# Patient Record
Sex: Male | Born: 1990 | Race: White | Hispanic: No | Marital: Married | Smoking: Never smoker
Health system: Southern US, Community
[De-identification: ages and names within clinical notes are randomized; demographics above are authoritative.]

## PROBLEM LIST (undated history)

## (undated) DIAGNOSIS — Z87442 Personal history of urinary calculi: Secondary | ICD-10-CM

## (undated) DIAGNOSIS — F319 Bipolar disorder, unspecified: Secondary | ICD-10-CM

## (undated) DIAGNOSIS — B179 Acute viral hepatitis, unspecified: Secondary | ICD-10-CM

## (undated) DIAGNOSIS — N200 Calculus of kidney: Secondary | ICD-10-CM

## (undated) DIAGNOSIS — F909 Attention-deficit hyperactivity disorder, unspecified type: Secondary | ICD-10-CM

## (undated) DIAGNOSIS — R51 Headache: Secondary | ICD-10-CM

## (undated) DIAGNOSIS — J189 Pneumonia, unspecified organism: Secondary | ICD-10-CM

## (undated) HISTORY — PX: BRAIN SURGERY: SHX531

## (undated) HISTORY — PX: KNEE SURGERY: SHX244

---
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
## 2013-04-25 ENCOUNTER — Emergency Department (HOSPITAL_COMMUNITY)
Admission: EM | Admit: 2013-04-25 | Discharge: 2013-04-25 | Disposition: A | Discharge status: Home / Self Care | Payer: Managed Care, Other (non HMO) | Source: Home / Self Care | Attending: Emergency Medicine | Admitting: Emergency Medicine

## 2013-04-25 ENCOUNTER — Encounter (HOSPITAL_COMMUNITY): Payer: Self-pay | Admitting: Emergency Medicine

## 2013-04-25 DIAGNOSIS — S83006A Unspecified dislocation of unspecified patella, initial encounter: Secondary | ICD-10-CM

## 2013-04-25 MED ORDER — HYDROCODONE-ACETAMINOPHEN 5-325 MG PO TABS: ORAL_TABLET | ORAL | Status: DC

## 2013-04-25 MED ORDER — DICLOFENAC SODIUM 75 MG PO TBEC: 75.0000 mg | DELAYED_RELEASE_TABLET | Freq: Two times a day (BID) | ORAL | Status: DC

## 2013-04-25 NOTE — ED Notes (Signed)
States he had a skateboard accident ~ 7 months ago, AND SINCE THEN, HAS HAD A RECURRENT episodes of knee coming out of place, came out twice today

## 2013-04-25 NOTE — ED Provider Notes (Signed)
  Chief Complaint   Chief Complaint  Patient presents with  . Knee Problem    History of Present Illness   Gregory [name] is a 23 year old male who injured his right knee in a skateboarding accident 7 months ago. He dislocated the patella laterally. [facility] made which were negative. Ever since then he's had recurring episodes of patellar dislocation, pain, swelling, and popping of the knee. It sometimes gives way. He localizes the pain to around the patella and sometimes in the popliteal fossa.  Review of Systems   Other than as noted above, the patient denies any of the following symptoms: Systemic:  No fevers or chills. Musculoskeletal:  No arthritis, swelling, back pain, or neck pain.  Neurological:  No muscular weakness or paresthesias.  PMFSH   Past medical history, family history, social history, meds, and allergies were reviewed.     Physical Examination   Vital signs:  BP 129/82  Pulse 75  Temp(Src) 98.6 F (37 C) (Oral)  Resp 20  SpO2 99% Gen:  Alert and oriented times 3.  In no distress. Musculoskeletal: He has pain to palpation over the patella, around the patella, and over the medial and lateral joint lines and in the popliteal fossa. The knee has a full range of motion with pain on full flexion. There is crepitus with flexion.   McMurray's test was negative.  Lachman's test was negative.  Anterior drawer test was negative.   Varus and valgus stress produces pain in both medially and laterally, but no opening up of the joint.  Otherwise, all joints had a full a ROM with no swelling, bruising or deformity.  No edema, pulses full. Extremities were warm and pink.  Capillary refill was brisk.  Skin:  Clear, warm and dry.  No rash. Neuro:  Alert and oriented times 3.  Muscle strength was normal.  Sensation was intact to light touch.   Assessment   The encounter diagnosis was Dislocated patella.  He has recurring dislocations of the  patella. This will probably require surgery. He'll need followup with orthopedics. In the meantime he was begun on quadriceps strengthening exercises, should wear a knee immobilizer when he can otherwise wear a knee sleeve.  Plan    1.  Meds:  The following meds were prescribed:   New Prescriptions   DICLOFENAC (VOLTAREN) 75 MG EC TABLET    Take 1 tablet (75 mg total) by mouth 2 (two) times daily.   HYDROCODONE-ACETAMINOPHEN (NORCO/VICODIN) 5-325 MG PER TABLET    1 to 2 tabs every 4 to 6 hours as needed for pain.    2.  Patient Education/Counseling:  The patient was given appropriate handouts, self care instructions, and instructed in symptomatic relief, including rest and activity, elevation, application of ice and compression.    3.  Follow up:  The patient was told to follow up here if no better in 3 to 4 days, or sooner if becoming worse in any way, and given some red flag symptoms such as worsening pain or neurological symptoms which would prompt immediate return.       Reuben Likesavid C Brandilyn Nanninga, MD 04/25/13 [phone]

## 2013-04-25 NOTE — Discharge Instructions (Signed)
Knee Dislocation Knee dislocation occurs when two or more linked knee bones shift out of alignment, and no longer touch. For this to happen, an injury must also occur to more than one of the four major ligaments of the knee. A ligament is soft tissue that holds bones together, across a joint. In the knee, ligaments hold the thigh bone (femur) and the shinbone (tibia) in place. When a ligament is stretched farther than it is designed to stretch, the ligament is at risk for sprain. Knee ligament sprains can be a disabling injury. SYMPTOMS   One or more "pops" heard or felt, at the time of injury.  Inability to continue activity after injury.  Knee swelling, noticed within 6 hours after injury. Possibly, deformity of the knee.  Loss of knee motion.  Knee giving way or buckling. Often, swelling with repeated giving way.  Sometimes locking, when there is also injury to the meniscus cartilage in the knee.  Rarely, injury to nerves (numbness, weakness, paralysis), discoloration, or coldness (due to artery injury) of the foot and ankle. CAUSES  Knee dislocation is the result of a force on the joint that is greater than the ligaments can withstand. This is often caused by a direct hit (trauma). It may also result from a non-contact injury (stepping in a hole in the ground, over extending the knee, twisting).  RISK INCREASES WITH:  Sports that involve pivoting, jumping, cutting, or changing direction (basketball, gymnastics, soccer, volleyball). Contact sports (football, rugby, lacrosse). Sports on uneven ground (cross country running, soccer).  Poor leg strength and flexibility.  Improper equipment. PREVENTION   Warm up and stretch properly before activity.  Maintain physical fitness:  Thigh, leg, and knee flexibility.  Muscle strength and endurance.  Learn and use proper exercise technique.  Wear proper equipment (correct length of cleats for the surface). PROGNOSIS  If treated  properly, which may require surgery, knee dislocations can be healed. Returning to sports is possible, but not guaranteed. Untreated knee dislocations often result in recurring injury or instability of the knee joint. RELATED COMPLICATIONS   Frequently recurring symptoms, such as knee giving way, instability, and swelling.  Injury to meniscal cartilage, resulting in locking and swelling of the knee.  Injury to other structures of the knee (bone, articular cartilage), resulting in knee arthritis.  Injury to other ligaments of the knee.  Knee stiffness (loss of knee motion).  Permanent injury to nerves (numbness, weakness, paralysis) or arteries.  Removal (amputation) of the leg, due to nerve or artery injury. TREATMENT Knee dislocations require immediate realigning of the bones (reduction), if they are displaced. This should be followed up with artery and nerve function tests. The knee should then be treated with ice and medicine, to reduce pain and inflammation. Walking with crutches is advised, to keep weight off the injury. Your caregiver may advise that you wear a brace, at first. Rehabilitation will be needed, to regain strength and control of knee function. A physical therapist may teach you techniques to avoid further knee injury. Surgery that is properly timed after the injury is often the best way to achieve a full recovery. Surgery involves replacement (reconstruction) of one or more of the injured ligaments. If it has been determined that you have an injury to an artery, this will also require surgery. MEDICATION   If pain medicine is needed, nonsteroidal anti-inflammatory medicines (aspirin and ibuprofen), or other minor pain relievers (acetaminophen), are often advised.  Do not take pain medicine for 7 days before  surgery.  Prescription pain relievers are usually prescribed only after surgery. Use only as directed and only as much as you need. HEAT AND COLD  Cold treatment  (icing) should be applied for 10 to 15 minutes every 2 to 3 hours for inflammation and pain, and immediately after activity that aggravates your symptoms. Use ice packs or an ice massage.  Heat treatment may be used before performing stretching and strengthening activities prescribed by your caregiver, physical therapist, or athletic trainer. Use a heat pack or a warm water soak. SEEK MEDICAL CARE IF:  Any of the following occur after injury or surgery:  You experience pain, numbness, coldness or blue, gray, or dark discoloration in the foot or toenails.  You develop signs of infection: fever, increased pain, swelling, redness, drainage of fluids, or bleeding in the affected area.  New, unexplained symptoms develop. (Drugs used in treatment may produce side effects.) Document Released: 01/25/2005 Document Revised: 04/19/2011 Document Reviewed: 05/09/2008 [facility] Patient Information 2014 [place], [place].  Knee pain can be caused by many conditions:  Osteoarthritis, gout, bursitis, tendonitis, cartiledge damage, condromalacia patella, patellofemoral syndrome, and ligament sprain to name just a few.  Often some simple conservative measures can help alleviate the pain.  Do not do the following:  Avoid squatting and doing deep knee bends.  This puts too much of load on your cartiledges and tendons.  If you do a knee bend, go only half way down, flexing your knee no more than 90 degrees.  Do the following:  If you are overweight or obese, lose weight.  This makes for a lot less load on your knee joints.  If you use tobacco, quit.  Nicotine causes spasm of the small arteries, decreases blood flow, and impairs your body's normal ability to repair damage.  If your knee is acutely inflamed, use the principles of RICE (rest, ice, compression, and elevation).  Wearing a knee brace can help.  These are usually made of neoprene and can be purchased over the counter at the drug store.  Use of over  the counter pain meds can be of help.  Tylenol (or acetaminophen) is the safest to use.  It often helps to take this regularly.  You can take up to 2 325 mg tablets 5 times daily, but it best to start out much lower that that, perhaps 2 325 mg tablets twice daily, then increase from there. People who are on the blood thinner warfarin have to be careful about taking high doses of Tylenol.  For people who are able to tolerate them, ibuprofen and naproxyn can also help with the pain.  You should discuss these agents with your physician before taking them.  People with chronic kidney disease, hypertension, peptic ulcer disease, and reflux can suffer adverse side effects. They should not be taken with warfarin. The maximum dosage of ibuprofen is 800 mg 3 times daily with meals.  The maximum dosage of naprosyn is 2 and 1/2 tablets twice daily with food, but again, start out low and gradually increase the dose until adequate pain relief is achieved. Ibuprofen and naprosyn should always be taken with food.  People with cartiledge injury or osteoarthritis may find glucosamine to be helpful.  This is an over-the-counter supplement that helps nourish and repair cartiledge.  The dose is 500 mg 3 times daily or 1500 mg taken in a single dose. This can take several months to work and it doesn't always work.    For people with knee pain  on just one side, use of a cane held in the hand on the same side as the knee pain takes some of the stress off the knee joint and can make a big difference in knee pain.  Wearing good shoes with adequate arch support is essential.  Regular exercise is of utmost importance.  Swimming, water aerobics, or use of an elliptical exerciser put the least stress on the knees of any exercise.  Finally doing the exercises below can be very helpful.  They tend to strengthen the muscles around the knee and provide extra support and stability.  Try to do them twice a day followed by ice for 10  minutes.

## 2013-06-12 ENCOUNTER — Other Ambulatory Visit (HOSPITAL_COMMUNITY): Payer: Self-pay | Admitting: Orthopedic Surgery

## 2013-06-25 ENCOUNTER — Encounter (HOSPITAL_COMMUNITY): Payer: Self-pay

## 2013-06-25 ENCOUNTER — Encounter (HOSPITAL_COMMUNITY)
Admission: RE | Admit: 2013-06-25 | Discharge: 2013-06-25 | Disposition: A | Discharge status: Home / Self Care | Payer: Managed Care, Other (non HMO) | Source: Ambulatory Visit | Attending: Orthopedic Surgery | Admitting: Orthopedic Surgery

## 2013-06-25 DIAGNOSIS — Z01812 Encounter for preprocedural laboratory examination: Secondary | ICD-10-CM | POA: Insufficient documentation

## 2013-06-25 HISTORY — DX: Bipolar disorder, unspecified: F31.9

## 2013-06-25 HISTORY — DX: Pneumonia, unspecified organism: J18.9

## 2013-06-25 HISTORY — DX: Headache: R51

## 2013-06-25 HISTORY — DX: Attention-deficit hyperactivity disorder, unspecified type: F90.9

## 2013-06-25 LAB — CBC
HEMATOCRIT: 45.2 % (ref 39.0–52.0)
HEMOGLOBIN: 15.5 g/dL (ref 13.0–17.0)
MCH: 28.3 pg (ref 26.0–34.0)
MCHC: 34.3 g/dL (ref 30.0–36.0)
MCV: 82.6 fL (ref 78.0–100.0)
PLATELETS: 265 10*3/uL (ref 150–400)
RBC: 5.47 MIL/uL (ref 4.22–5.81)
RDW: 12.2 % (ref 11.5–15.5)
WBC: 6.4 10*3/uL (ref 4.0–10.5)

## 2013-06-25 NOTE — Pre-Procedure Instructions (Signed)
Gregory DockerColton T Bolds  06/25/2013   Your procedure is scheduled on:  Tuesday, Jul 03, 2013  [facility] [address] at [employer]10:45 AM.  Call this number if you have problems the morning of surgery: [phone]   Remember:   Do not eat food or drink liquids after midnight.   Take these medicines the morning of surgery with A SIP OF WATER: Hydrocodone-acetaminophen (Norco/Vicodin) if needed  STOP taking diclofenac (Voltaren) as directed by physician. Stop taking Aspirin, Goody's, BC's, Aleve (Naproxen), Ibuprofen (Advil or Motrin), Fish Oil, Vitamins, Herbal Supplements or any substance that could thin your blood starting today, Jun 25, 2013.   Do not wear jewelry.  Do not wear lotions, powders, or colognes. You may wear deodorant.  Do not shave 48 hours prior to surgery. Men may shave face and neck.  Do not bring valuables to the hospital.  [facility] is not responsible                  for any belongings or valuables.               Contacts, dentures or bridgework may not be worn into surgery.  Leave suitcase in the car. After surgery it may be brought to your room.  For patients admitted to the hospital, discharge time is determined by your                treatment team.               Patients discharged the day of surgery will not be allowed to drive  home.    Special Instructions: Please use CHG soap the night before surgery and the day of surgery. CHG soap should be used atleast twice.   Please read over the following fact sheets that you were given: Pain Booklet, Coughing and Deep Breathing and Surgical Site Infection Prevention.

## 2013-07-02 MED ORDER — CHLORHEXIDINE GLUCONATE 4 % EX LIQD
60.0000 mL | Freq: Once | CUTANEOUS | Status: DC
Start: 2013-07-02 — End: 2013-07-03
  Filled 2013-07-02: qty 60

## 2013-07-02 MED ORDER — CEFAZOLIN SODIUM-DEXTROSE 2-3 GM-% IV SOLR
2.0000 g | INTRAVENOUS | Status: AC
  Administered 2013-07-03 (×2): 2 g via INTRAVENOUS
  Filled 2013-07-02: qty 50

## 2013-07-02 MED ORDER — CHLORHEXIDINE GLUCONATE 4 % EX LIQD
60.0000 mL | Freq: Once | CUTANEOUS | Status: DC
  Filled 2013-07-02: qty 60

## 2013-07-03 ENCOUNTER — Ambulatory Visit (HOSPITAL_COMMUNITY): Payer: Managed Care, Other (non HMO) | Admitting: Anesthesiology

## 2013-07-03 ENCOUNTER — Encounter (HOSPITAL_COMMUNITY): Payer: Self-pay | Admitting: Anesthesiology

## 2013-07-03 ENCOUNTER — Encounter (HOSPITAL_COMMUNITY)
Admission: RE | Disposition: A | Discharge status: Home / Self Care | Payer: Self-pay | Source: Ambulatory Visit | Attending: Orthopedic Surgery

## 2013-07-03 ENCOUNTER — Encounter (HOSPITAL_COMMUNITY): Payer: Managed Care, Other (non HMO) | Admitting: Anesthesiology

## 2013-07-03 ENCOUNTER — Observation Stay (HOSPITAL_COMMUNITY)
Admission: RE | Admit: 2013-07-03 | Discharge: 2013-07-04 | Disposition: A | Discharge status: Home / Self Care | Payer: Managed Care, Other (non HMO) | Source: Ambulatory Visit | Attending: Orthopedic Surgery | Admitting: Orthopedic Surgery

## 2013-07-03 DIAGNOSIS — F909 Attention-deficit hyperactivity disorder, unspecified type: Secondary | ICD-10-CM | POA: Insufficient documentation

## 2013-07-03 DIAGNOSIS — M238X9 Other internal derangements of unspecified knee: Secondary | ICD-10-CM | POA: Insufficient documentation

## 2013-07-03 DIAGNOSIS — S83509A Sprain of unspecified cruciate ligament of unspecified knee, initial encounter: Principal | ICD-10-CM | POA: Insufficient documentation

## 2013-07-03 DIAGNOSIS — S83519A Sprain of anterior cruciate ligament of unspecified knee, initial encounter: Secondary | ICD-10-CM

## 2013-07-03 DIAGNOSIS — F319 Bipolar disorder, unspecified: Secondary | ICD-10-CM | POA: Insufficient documentation

## 2013-07-03 DIAGNOSIS — IMO0002 Reserved for concepts with insufficient information to code with codable children: Secondary | ICD-10-CM | POA: Insufficient documentation

## 2013-07-03 DIAGNOSIS — X58XXXA Exposure to other specified factors, initial encounter: Secondary | ICD-10-CM | POA: Insufficient documentation

## 2013-07-03 HISTORY — PX: POSTERIOR CRUCIATE LIGAMENT RECONSTRUCTION: SHX749

## 2013-07-03 HISTORY — PX: ANTERIOR CRUCIATE LIGAMENT REPAIR: SHX115

## 2013-07-03 LAB — PROTIME-INR
INR: 1.17 (ref 0.00–1.49)
Prothrombin Time: 14.7 seconds (ref 11.6–15.2)

## 2013-07-03 SURGERY — RECONSTRUCTION, KNEE, ACL, USING HAMSTRING GRAFT
Anesthesia: General | Site: Knee | Laterality: Right

## 2013-07-03 MED ORDER — CLONIDINE HCL (ANALGESIA) 100 MCG/ML EP SOLN
EPIDURAL | Status: DC | PRN
  Administered 2013-07-03: 1 mL via INTRA_ARTICULAR

## 2013-07-03 MED ORDER — OXYCODONE HCL 5 MG PO TABS
5.0000 mg | ORAL_TABLET | ORAL | Status: DC | PRN
  Administered 2013-07-03 – 2013-07-04 (×2): 10 mg via ORAL
  Filled 2013-07-03 (×2): qty 2

## 2013-07-03 MED ORDER — BUPIVACAINE HCL (PF) 0.25 % IJ SOLN
INTRAMUSCULAR | Status: DC | PRN
  Administered 2013-07-03: 10 mL

## 2013-07-03 MED ORDER — ONDANSETRON HCL 4 MG/2ML IJ SOLN
INTRAMUSCULAR | Status: DC | PRN
  Administered 2013-07-03: 4 mg via INTRAVENOUS

## 2013-07-03 MED ORDER — METOCLOPRAMIDE HCL 5 MG/ML IJ SOLN: 5.0000 mg | Freq: Three times a day (TID) | INTRAMUSCULAR | Status: DC | PRN

## 2013-07-03 MED ORDER — KETOROLAC TROMETHAMINE 30 MG/ML IJ SOLN: 15.0000 mg | Freq: Once | INTRAMUSCULAR | Status: DC | PRN

## 2013-07-03 MED ORDER — GLYCOPYRROLATE 0.2 MG/ML IJ SOLN
INTRAMUSCULAR | Status: AC
  Filled 2013-07-03: qty 2

## 2013-07-03 MED ORDER — OXYCODONE HCL 5 MG PO TABS: 5.0000 mg | ORAL_TABLET | Freq: Once | ORAL | Status: DC | PRN

## 2013-07-03 MED ORDER — MORPHINE SULFATE 4 MG/ML IJ SOLN
INTRAMUSCULAR | Status: AC
  Filled 2013-07-03: qty 2

## 2013-07-03 MED ORDER — METHOCARBAMOL 1000 MG/10ML IJ SOLN
500.0000 mg | Freq: Four times a day (QID) | INTRAVENOUS | Status: DC | PRN
  Filled 2013-07-03: qty 5

## 2013-07-03 MED ORDER — ACETAMINOPHEN 160 MG/5ML PO SOLN
325.0000 mg | ORAL | Status: DC | PRN
  Filled 2013-07-03: qty 20.3

## 2013-07-03 MED ORDER — MIDAZOLAM HCL 2 MG/2ML IJ SOLN: 1.0000 mg | INTRAMUSCULAR | Status: DC | PRN

## 2013-07-03 MED ORDER — MORPHINE SULFATE 4 MG/ML IJ SOLN
INTRAMUSCULAR | Status: DC | PRN
  Administered 2013-07-03: 8 mg via INTRAVENOUS

## 2013-07-03 MED ORDER — ONDANSETRON HCL 4 MG/2ML IJ SOLN
INTRAMUSCULAR | Status: AC
  Filled 2013-07-03: qty 2

## 2013-07-03 MED ORDER — OXYCODONE HCL 5 MG/5ML PO SOLN: 5.0000 mg | Freq: Once | ORAL | Status: DC | PRN

## 2013-07-03 MED ORDER — POTASSIUM CHLORIDE IN NACL 20-0.9 MEQ/L-% IV SOLN
INTRAVENOUS | Status: DC
  Administered 2013-07-03: 19:00:00 via INTRAVENOUS
  Filled 2013-07-03 (×3): qty 1000

## 2013-07-03 MED ORDER — WARFARIN - PHARMACIST DOSING INPATIENT: Freq: Every day | Status: DC

## 2013-07-03 MED ORDER — DOCUSATE SODIUM 100 MG PO CAPS
100.0000 mg | ORAL_CAPSULE | Freq: Two times a day (BID) | ORAL | Status: DC
  Administered 2013-07-03 – 2013-07-04 (×2): 100 mg via ORAL
  Filled 2013-07-03 (×3): qty 1

## 2013-07-03 MED ORDER — 0.9 % SODIUM CHLORIDE (POUR BTL) OPTIME
TOPICAL | Status: DC | PRN
  Administered 2013-07-03: 1000 mL

## 2013-07-03 MED ORDER — FENTANYL CITRATE 0.05 MG/ML IJ SOLN
INTRAMUSCULAR | Status: AC
  Filled 2013-07-03: qty 5

## 2013-07-03 MED ORDER — SODIUM CHLORIDE 0.9 % IR SOLN
Status: DC | PRN
  Administered 2013-07-03: 18000 mL
  Administered 2013-07-03: 3000 mL

## 2013-07-03 MED ORDER — ONDANSETRON HCL 4 MG PO TABS: 4.0000 mg | ORAL_TABLET | Freq: Four times a day (QID) | ORAL | Status: DC | PRN

## 2013-07-03 MED ORDER — FENTANYL CITRATE 0.05 MG/ML IJ SOLN: 50.0000 ug | INTRAMUSCULAR | Status: DC | PRN

## 2013-07-03 MED ORDER — ROCURONIUM BROMIDE 100 MG/10ML IV SOLN
INTRAVENOUS | Status: DC | PRN
  Administered 2013-07-03: 50 mg via INTRAVENOUS

## 2013-07-03 MED ORDER — WARFARIN SODIUM 7.5 MG PO TABS
7.5000 mg | ORAL_TABLET | Freq: Once | ORAL | Status: AC
  Administered 2013-07-03: 7.5 mg via ORAL
  Filled 2013-07-03: qty 1

## 2013-07-03 MED ORDER — LACTATED RINGERS IV SOLN
INTRAVENOUS | Status: DC | PRN
  Administered 2013-07-03 (×2): via INTRAVENOUS

## 2013-07-03 MED ORDER — PROMETHAZINE HCL 25 MG/ML IJ SOLN
6.2500 mg | INTRAMUSCULAR | Status: DC | PRN
  Administered 2013-07-03: 6.25 mg via INTRAVENOUS

## 2013-07-03 MED ORDER — METHOCARBAMOL 500 MG PO TABS
500.0000 mg | ORAL_TABLET | Freq: Four times a day (QID) | ORAL | Status: DC | PRN
  Administered 2013-07-03 – 2013-07-04 (×2): 500 mg via ORAL
  Filled 2013-07-03 (×2): qty 1

## 2013-07-03 MED ORDER — ROPIVACAINE HCL 5 MG/ML IJ SOLN
INTRAMUSCULAR | Status: DC | PRN
  Administered 2013-07-03: 16 mL via PERINEURAL

## 2013-07-03 MED ORDER — LACTATED RINGERS IV SOLN
INTRAVENOUS | Status: DC
  Administered 2013-07-03: 50 mL/h via INTRAVENOUS

## 2013-07-03 MED ORDER — ACETAMINOPHEN 325 MG PO TABS: 325.0000 mg | ORAL_TABLET | ORAL | Status: DC | PRN

## 2013-07-03 MED ORDER — PROMETHAZINE HCL 25 MG/ML IJ SOLN
INTRAMUSCULAR | Status: AC
Start: 2013-07-03 — End: 2013-07-04
  Filled 2013-07-03: qty 1

## 2013-07-03 MED ORDER — LIDOCAINE HCL (CARDIAC) 20 MG/ML IV SOLN
INTRAVENOUS | Status: AC
  Filled 2013-07-03: qty 5

## 2013-07-03 MED ORDER — BUPIVACAINE HCL (PF) 0.25 % IJ SOLN
INTRAMUSCULAR | Status: AC
  Filled 2013-07-03: qty 30

## 2013-07-03 MED ORDER — PROPOFOL 10 MG/ML IV BOLUS
INTRAVENOUS | Status: AC
  Filled 2013-07-03: qty 20

## 2013-07-03 MED ORDER — ROCURONIUM BROMIDE 50 MG/5ML IV SOLN
INTRAVENOUS | Status: AC
  Filled 2013-07-03: qty 1

## 2013-07-03 MED ORDER — MORPHINE SULFATE 2 MG/ML IJ SOLN
1.0000 mg | INTRAMUSCULAR | Status: DC | PRN
  Administered 2013-07-03 – 2013-07-04 (×2): 1 mg via INTRAVENOUS
  Filled 2013-07-03 (×2): qty 1

## 2013-07-03 MED ORDER — MIDAZOLAM HCL 5 MG/5ML IJ SOLN
INTRAMUSCULAR | Status: DC | PRN
  Administered 2013-07-03 (×2): 1 mg via INTRAVENOUS

## 2013-07-03 MED ORDER — MIDAZOLAM HCL 2 MG/2ML IJ SOLN
INTRAMUSCULAR | Status: AC
  Filled 2013-07-03: qty 2

## 2013-07-03 MED ORDER — ONDANSETRON HCL 4 MG/2ML IJ SOLN: 4.0000 mg | Freq: Four times a day (QID) | INTRAMUSCULAR | Status: DC | PRN

## 2013-07-03 MED ORDER — CEFAZOLIN SODIUM-DEXTROSE 2-3 GM-% IV SOLR
INTRAVENOUS | Status: AC
  Filled 2013-07-03: qty 50

## 2013-07-03 MED ORDER — HYDROMORPHONE HCL PF 1 MG/ML IJ SOLN: 0.2500 mg | INTRAMUSCULAR | Status: DC | PRN

## 2013-07-03 MED ORDER — FENTANYL CITRATE 0.05 MG/ML IJ SOLN
INTRAMUSCULAR | Status: DC | PRN
  Administered 2013-07-03: 100 ug via INTRAVENOUS
  Administered 2013-07-03 (×3): 50 ug via INTRAVENOUS

## 2013-07-03 MED ORDER — METOCLOPRAMIDE HCL 10 MG PO TABS: 5.0000 mg | ORAL_TABLET | Freq: Three times a day (TID) | ORAL | Status: DC | PRN

## 2013-07-03 MED ORDER — WARFARIN VIDEO: Freq: Once | Status: DC

## 2013-07-03 MED ORDER — NEOSTIGMINE METHYLSULFATE 10 MG/10ML IV SOLN
INTRAVENOUS | Status: AC
  Filled 2013-07-03: qty 1

## 2013-07-03 MED ORDER — COUMADIN BOOK
Freq: Once | Status: AC
  Administered 2013-07-03: 22:00:00
  Filled 2013-07-03: qty 1

## 2013-07-03 MED ORDER — CEFAZOLIN SODIUM 1-5 GM-% IV SOLN
1.0000 g | Freq: Three times a day (TID) | INTRAVENOUS | Status: AC
  Administered 2013-07-03 – 2013-07-04 (×2): 1 g via INTRAVENOUS

## 2013-07-03 MED ORDER — PROPOFOL 10 MG/ML IV BOLUS
INTRAVENOUS | Status: DC | PRN
  Administered 2013-07-03: 200 mg via INTRAVENOUS

## 2013-07-03 MED ORDER — LIDOCAINE HCL (CARDIAC) 20 MG/ML IV SOLN
INTRAVENOUS | Status: DC | PRN
  Administered 2013-07-03: 100 mg via INTRAVENOUS

## 2013-07-03 SURGICAL SUPPLY — 113 items
ANCHOR BUTTON TIGHTROPE ACL RT (Orthopedic Implant) ×6 IMPLANT
ANCHOR SUT SWIVELLOK BIO (Anchor) ×6 IMPLANT
BANDAGE ELASTIC 6 VELCRO ST LF (GAUZE/BANDAGES/DRESSINGS) ×6 IMPLANT
BANDAGE ESMARK 6X9 LF (GAUZE/BANDAGES/DRESSINGS) ×1 IMPLANT
BLADE CUDA 5.5 (BLADE) IMPLANT
BLADE CUTTER GATOR 3.5 (BLADE) ×3 IMPLANT
BLADE GREAT WHITE 4.2 (BLADE) ×2 IMPLANT
BLADE GREAT WHITE 4.2MM (BLADE) ×1
BLADE MINI RND TIP GREEN BEAV (BLADE) IMPLANT
BLADE SURG 10 STRL SS (BLADE) ×3 IMPLANT
BLADE SURG 15 STRL LF DISP TIS (BLADE) ×3 IMPLANT
BLADE SURG 15 STRL SS (BLADE) ×6
BNDG ELASTIC 6X10 VLCR STRL LF (GAUZE/BANDAGES/DRESSINGS) ×3 IMPLANT
BNDG ELASTIC 6X15 VLCR STRL LF (GAUZE/BANDAGES/DRESSINGS) ×3 IMPLANT
BNDG ESMARK 6X9 LF (GAUZE/BANDAGES/DRESSINGS) ×3
BONE MATRIX DEMINERALIZED 1CC (Bone Implant) ×6 IMPLANT
BUR OVAL 6.0 (BURR) ×3 IMPLANT
BUTTON EXT TIGHTROPE 5X20 (Orthopedic Implant) ×2 IMPLANT
BUTTON EXT TIGHTROPE 5X20MM (Orthopedic Implant) ×1 IMPLANT
CANNULA 5.75X71 LONG (CANNULA) IMPLANT
CANNULA TWIST IN 8.25X7CM (CANNULA) IMPLANT
CLOSURE STERI-STRIP 1/2X4 (GAUZE/BANDAGES/DRESSINGS) ×1
CLOSURE WOUND 1/2 X4 (GAUZE/BANDAGES/DRESSINGS) ×2
CLSR STERI-STRIP ANTIMIC 1/2X4 (GAUZE/BANDAGES/DRESSINGS) ×2 IMPLANT
COVER SURGICAL LIGHT HANDLE (MISCELLANEOUS) ×6 IMPLANT
CUFF TOURNIQUET SINGLE 34IN LL (TOURNIQUET CUFF) IMPLANT
CUFF TOURNIQUET SINGLE 44IN (TOURNIQUET CUFF) IMPLANT
DECANTER SPIKE VIAL GLASS SM (MISCELLANEOUS) ×3 IMPLANT
DRAPE ARTHROSCOPY W/POUCH 114 (DRAPES) ×3 IMPLANT
DRAPE INCISE IOBAN 66X45 STRL (DRAPES) ×6 IMPLANT
DRAPE U-SHAPE 47X51 STRL (DRAPES) ×3 IMPLANT
DRILL FLIPCUTTER II 8.5MM (INSTRUMENTS) ×1 IMPLANT
DRILL FLIPCUTTER II 9.0MM (INSTRUMENTS) ×1 IMPLANT
DRSG PAD ABDOMINAL 8X10 ST (GAUZE/BANDAGES/DRESSINGS) ×3 IMPLANT
ELECT REM PT RETURN 9FT ADLT (ELECTROSURGICAL) ×3
ELECTRODE REM PT RTRN 9FT ADLT (ELECTROSURGICAL) ×1 IMPLANT
EVACUATOR 1/8 PVC DRAIN (DRAIN) IMPLANT
FLIPCUTTER II 8.5MM (INSTRUMENTS) ×3
FLIPCUTTER II 9.0MM (INSTRUMENTS) ×3
FLUID NSS /IRRIG 3000 ML XXX (IV SOLUTION) ×6 IMPLANT
GAUZE XEROFORM 1X8 LF (GAUZE/BANDAGES/DRESSINGS) ×3 IMPLANT
GAUZE XEROFORM 5X9 LF (GAUZE/BANDAGES/DRESSINGS) ×3 IMPLANT
GLOVE BIO SURGEON ST LM GN SZ9 (GLOVE) ×3 IMPLANT
GLOVE BIOGEL PI IND STRL 7.5 (GLOVE) ×1 IMPLANT
GLOVE BIOGEL PI IND STRL 8 (GLOVE) ×1 IMPLANT
GLOVE BIOGEL PI INDICATOR 7.5 (GLOVE) ×2
GLOVE BIOGEL PI INDICATOR 8 (GLOVE) ×2
GLOVE ECLIPSE 7.0 STRL STRAW (GLOVE) ×3 IMPLANT
GLOVE SURG ORTHO 8.0 STRL STRW (GLOVE) ×3 IMPLANT
GOWN STRL REUS W/ TWL LRG LVL3 (GOWN DISPOSABLE) ×4 IMPLANT
GOWN STRL REUS W/ TWL XL LVL3 (GOWN DISPOSABLE) ×1 IMPLANT
GOWN STRL REUS W/TWL LRG LVL3 (GOWN DISPOSABLE) ×8
GOWN STRL REUS W/TWL XL LVL3 (GOWN DISPOSABLE) ×2
KIT BASIN OR (CUSTOM PROCEDURE TRAY) ×3 IMPLANT
KIT BIOCARTILAGE DEL W/SYRINGE (KITS) ×3 IMPLANT
KIT ROOM TURNOVER OR (KITS) ×3 IMPLANT
KIT TRANSTIBIAL (DISPOSABLE) IMPLANT
MANIFOLD NEPTUNE II (INSTRUMENTS) ×3 IMPLANT
MARKER SKIN DUAL TIP RULER LAB (MISCELLANEOUS) ×3 IMPLANT
NEEDLE 18GX1X1/2 (RX/OR ONLY) (NEEDLE) ×3 IMPLANT
NEEDLE SCORPION MULTI FIRE (NEEDLE) IMPLANT
NS IRRIG 1000ML POUR BTL (IV SOLUTION) ×3 IMPLANT
PACK ARTHROSCOPY DSU (CUSTOM PROCEDURE TRAY) ×3 IMPLANT
PAD ABD 8X10 STRL (GAUZE/BANDAGES/DRESSINGS) ×3 IMPLANT
PAD ARMBOARD 7.5X6 YLW CONV (MISCELLANEOUS) ×6 IMPLANT
PAD CAST 4YDX4 CTTN HI CHSV (CAST SUPPLIES) ×1 IMPLANT
PADDING CAST COTTON 4X4 STRL (CAST SUPPLIES) ×2
PADDING CAST COTTON 6X4 STRL (CAST SUPPLIES) ×9 IMPLANT
PASSER SUT SWANSON 36MM LOOP (INSTRUMENTS) ×3 IMPLANT
PENCIL BUTTON HOLSTER BLD 10FT (ELECTRODE) ×3 IMPLANT
REAMER C 10MM (INSTRUMENTS) IMPLANT
SET ARTHROSCOPY TUBING (MISCELLANEOUS) ×2
SET ARTHROSCOPY TUBING LN (MISCELLANEOUS) ×1 IMPLANT
SPONGE GAUZE 4X4 12PLY (GAUZE/BANDAGES/DRESSINGS) ×6 IMPLANT
SPONGE LAP 18X18 X RAY DECT (DISPOSABLE) ×6 IMPLANT
SPONGE LAP 4X18 X RAY DECT (DISPOSABLE) ×6 IMPLANT
SPONGE SCRUB IODOPHOR (GAUZE/BANDAGES/DRESSINGS) ×3 IMPLANT
STRIP CLOSURE SKIN 1/2X4 (GAUZE/BANDAGES/DRESSINGS) ×4 IMPLANT
SUCTION FRAZIER TIP 10 FR DISP (SUCTIONS) ×3 IMPLANT
SUT 2 FIBERLOOP 20 STRT BLUE (SUTURE) ×6
SUT ETHIBOND 5 LR DA (SUTURE) IMPLANT
SUT ETHILON 3 0 PS 1 (SUTURE) ×3 IMPLANT
SUT FIBERWIRE #2 38 T-5 BLUE (SUTURE) ×6
SUT MENISCAL KIT (KITS) IMPLANT
SUT PDS AB 1 CT  36 (SUTURE)
SUT PDS AB 1 CT 36 (SUTURE) IMPLANT
SUT PROLENE 3 0 PS 2 (SUTURE) ×6 IMPLANT
SUT VIC AB 0 CT1 27 (SUTURE) ×6
SUT VIC AB 0 CT1 27XBRD ANBCTR (SUTURE) ×3 IMPLANT
SUT VIC AB 1 CT1 27 (SUTURE) ×4
SUT VIC AB 1 CT1 27XBRD ANBCTR (SUTURE) ×2 IMPLANT
SUT VIC AB 2-0 CT1 27 (SUTURE) ×6
SUT VIC AB 2-0 CT1 TAPERPNT 27 (SUTURE) ×3 IMPLANT
SUT VIC AB 2-0 FS1 27 (SUTURE) ×6 IMPLANT
SUT VICRYL 0 TIES 12 18 (SUTURE) IMPLANT
SUTURE 2 FIBERLOOP 20 STRT BLU (SUTURE) ×2 IMPLANT
SUTURE FIBERWR #2 38 T-5 BLUE (SUTURE) ×2 IMPLANT
SUTURE TIGERSTICK 2 TIGERWIR 2 (MISCELLANEOUS) ×1 IMPLANT
SYR 20ML ECCENTRIC (SYRINGE) ×3 IMPLANT
SYR 30ML LL (SYRINGE) ×3 IMPLANT
SYR 30ML SLIP (SYRINGE) ×3 IMPLANT
SYR BULB IRRIGATION 50ML (SYRINGE) ×3 IMPLANT
SYR TB 1ML LUER SLIP (SYRINGE) ×3 IMPLANT
TIGERSTICK 2 TIGERWIRE 2 (MISCELLANEOUS) ×3
TOWEL OR 17X24 6PK STRL BLUE (TOWEL DISPOSABLE) ×3 IMPLANT
TOWEL OR 17X26 10 PK STRL BLUE (TOWEL DISPOSABLE) ×3 IMPLANT
TRAY FOLEY CATH 16FRSI W/METER (SET/KITS/TRAYS/PACK) ×3 IMPLANT
TUBE CONNECTING 12'X1/4 (SUCTIONS) ×1
TUBE CONNECTING 12X1/4 (SUCTIONS) ×2 IMPLANT
UNDERPAD 30X30 INCONTINENT (UNDERPADS AND DIAPERS) ×3 IMPLANT
WAND HAND CNTRL MULTIVAC 90 (MISCELLANEOUS) ×3 IMPLANT
WATER STERILE IRR 1000ML POUR (IV SOLUTION) ×3 IMPLANT
WRAP KNEE MAXI GEL POST OP (GAUZE/BANDAGES/DRESSINGS) ×3 IMPLANT

## 2013-07-03 NOTE — H&P (Signed)
Gregory [name] is an 23 y.o. male.   Chief Complaint: Right knee pain and instability HPI: : This 23 year old patient with right knee pain and instability. Had an injury several months ago has had symptomatic instability since that time. MRI scan is consistent with anterior cruciate ligament tear. He also states his foot is externally rotating morning walks since this accident. He wants to be a wi children who are at home. His family history and personal history is negative for DVT or pulmonary embolism  Past Medical History  Diagnosis Date  . Headache(784.0)   . ADHD (attention deficit hyperactivity disorder)     denies taking meds  . Bipolar disorder     Pt reports that this runs in his family but he has never been officially diagnosed. Pt reports that his mom used to give him Seroquel as a child but "made me feel like a zombie so I wouldn't take it".  . Pneumonia     Past Surgical History  Procedure Laterality Date  . Knee surgery    . Brain surgery  Pt was in 8th grade    Gregory [name]. 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 Pt reports that heart lining of cow was then attached so that tissue would expand as pt grew. Dr. Trey SailorsMark [name] performed surgery.  . Fracture surgery  2010    nasal fracture    No family history on file. Social History:  reports that he has never smoked. He has never used smokeless tobacco. He reports that he does not drink alcohol or use illicit drugs.  Allergies: No Known Allergies  No prescriptions prior to admission    No results found for this or any previous visit (from the past 48 hour(s)). No results found.  Review of Systems  Constitutional: Negative.   HENT: Negative.   Eyes: Negative.   Respiratory: Negative.   Cardiovascular: Negative.   Gastrointestinal: Negative.   Genitourinary: Negative.   Musculoskeletal: Positive for joint pain.  Skin: Negative.   Neurological: Negative.   Endo/Heme/Allergies: Negative.    Psychiatric/Behavioral: Negative.     There were no vitals taken for this visit. Physical Exam  Constitutional: He appears well-developed.  HENT:  Head: Normocephalic.  Eyes: Pupils are equal, round, and reactive to light.  Neck: Normal range of motion.  Cardiovascular: Normal rate.   Respiratory: Effort normal.  Neurological: He is alert.  Skin: Skin is warm.  Psychiatric: He has a normal mood and affect.    examination the right knee demonstrates full range of motion no varus thrust when he walks collateral seen stable at 0 and 30 on the wake examination he does have increased extra rotation and 15 of flexion on the right compared to the left anterior cruciate ligament is out PCL intact extensor mechanism is intact there is no joint line tenderness patient has palpable pedal pulses intact or/and plantar flexion strength and intact sensation on the dorsum plantar aspect of his foot   Assessment/Plan Impression is right knee anterior cruciate ligament tear possible meniscal tear as well as posterior lateral rotatory instability plan is a short reconstruction with hamstring autograft would also like to do posterior lateral corner reconstruction with hamstring autograft from the contralateral leg versus allograft reconstruction. Risk and benefits discussed with the patient including but not limited to infection nerve and vessel damage numbness knee stiffness continued instability need for more surgery all questions answered plan right same hospital possible. Protected weightbearing as well due  to the posterior lateral corner reconstruction  Gregory [name] 07/03/2013, 7:29 AM

## 2013-07-03 NOTE — Transfer of Care (Signed)
Immediate Anesthesia Transfer of Care Note  Patient: Gregory [name]  Procedure(s) Performed: Procedure(s) with comments: RECONSTRUCTION ANTERIOR CRUCIATE LIGAMENT (ACL) WITH HAMSTRING GRAFT (Right) - RIGHT KNEE ANTERIOR CRUCIATE LIGAMENT RECONSTRUCTION, POSTERIOR CRUCIATE LIGAMENT RECONSTRUCTION, MENISCAL  DEBRIDEMENT, HAMSTRING AUTOGRAFT. RECONSTRUCTION POSTERIOR CRUCIATE LIGAMENT (PCL) (Right)  Patient Location: PACU  Anesthesia Type:General and Regional  Level of Consciousness: awake, alert  and oriented  Airway & Oxygen Therapy: Patient Spontanous Breathing and Patient connected to nasal cannula oxygen  Post-op Assessment: Report given to PACU RN and Post -op Vital signs reviewed and stable  Post vital signs: Reviewed and stable  Complications: No apparent anesthesia complications

## 2013-07-03 NOTE — Progress Notes (Signed)
Orthopedic Tech Progress Note Patient Details:  Gregory [name] 02-Feb-1991 [id number]  CPM Right Knee CPM Right Knee: On Right Knee Flexion (Degrees): 30 Right Knee Extension (Degrees): 0 Additional Comments: put ohf on bed   Jennye Moccasin 07/03/2013, 8:17 PM

## 2013-07-03 NOTE — Anesthesia Procedure Notes (Addendum)
Anesthesia Regional Block:  Adductor canal block  Pre-Anesthetic Checklist: ,, timeout performed, Correct Patient, Correct Site, Correct Laterality, Correct Procedure, Correct Position, site marked, Risks and benefits discussed,  Surgical consent,  Pre-op evaluation,  At surgeon's request and post-op pain management  Laterality: Lower and Right  Prep: chloraprep       Needles:  Injection technique: Single-shot  Needle Type: Echogenic Needle          Additional Needles:  Procedures: ultrasound guided (picture in chart) Adductor canal block Narrative:  Start time: 07/03/2013 12:51 PM End time: 07/03/2013 12:56 PM Injection made incrementally with aspirations every 5 mL.  Performed by: Personally  Anesthesiologist: Maple Hudson  Additional Notes: H+P and labs reviewed, risks and benefits discussed with patient, procedure tolerated well without complications   Procedure Name: Intubation Date/Time: 07/03/2013 12:46 PM Performed by: Leonel Ramsay Pre-anesthesia Checklist: Patient identified, Patient being monitored, Emergency Drugs available, Timeout performed and Suction available Patient Re-evaluated:Patient Re-evaluated prior to inductionOxygen Delivery Method: Circle system utilized Preoxygenation: Pre-oxygenation with 100% oxygen Intubation Type: IV induction Ventilation: Mask ventilation without difficulty Laryngoscope Size: Miller and 2 Grade View: Grade I Tube type: Oral Tube size: 7.5 mm Number of attempts: 1 Airway Equipment and Method: Stylet Placement Confirmation: ETT inserted through vocal cords under direct vision,  positive ETCO2 and breath sounds checked- equal and bilateral Secured at: 22 cm Tube secured with: Tape Dental Injury: Teeth and Oropharynx as per pre-operative assessment

## 2013-07-03 NOTE — Brief Op Note (Signed)
07/03/2013  5:25 PM  PATIENT:  Gregory [name]  23 y.o. male  PRE-OPERATIVE DIAGNOSIS:  RIGHT KNEE ACL TEAR, POSTERIOLATERAL ROTATORY INSTABILITY  POST-OPERATIVE DIAGNOSIS:  RIGHT KNEE ACL TEAR, POSTERIOLATERAL ROTATORY INSTABILITY  PROCEDURE:  Procedure(s): RECONSTRUCTION ANTERIOR CRUCIATE LIGAMENT (ACL) WITH HAMSTRING GRAFT RECONSTRUCTION POSTERIOR lateral corner and fcl, partial medail menisectomy  SURGEON:  Surgeon(s): [name], MD  ASSISTANT: b roberts pa  ANESTHESIA:   general  EBL: 50 ml    Total I/O In: 1400 [I.V.:1400] Out: 250 [Urine:200; Blood:50]  BLOOD ADMINISTERED: none  DRAINS: none   LOCAL MEDICATIONS USED:  Marcaine mso4 clonidine  SPECIMEN:  No Specimen  COUNTS:  YES  TOURNIQUET:  * Missing tourniquet times found for documented tourniquets in log:  [id number] * Total Tourniquet Time Documented: Thigh (Left) - 7 minutes Total: Thigh (Left) - 7 minutes  Thigh (Right) - 125 minutes Total: Thigh (Right) - 125 minutes   DICTATION: .Other Dictation: Dictation Number [id number]  PLAN OF CARE: Admit for overnight observation  PATIENT DISPOSITION:  PACU - hemodynamically stable

## 2013-07-03 NOTE — Progress Notes (Addendum)
ANTICOAGULATION CONSULT NOTE - Initial Consult  Pharmacy Consult for warfarin Indication: VTE prophylaxis  No Known Allergies  Patient Measurements: Height: 6' (182.9 cm) Weight: 167 lb 15.9 oz (76.2 kg) IBW/kg (Calculated) : 77.6 Heparin Dosing Weight:   Vital Signs: Temp: 97.4 F (36.3 C) (05/26 1840) Temp src: Oral (05/26 1023) BP: 121/68 mmHg (05/26 1842) Pulse Rate: 50 (05/26 1842)  Labs: No results found for this basename: HGB, HCT, PLT, APTT, LABPROT, INR, HEPARINUNFRC, CREATININE, CKTOTAL, CKMB, TROPONINI,  in the last 72 hours  CrCl is unknown because no creatinine reading has been taken.   Medical History: Past Medical History  Diagnosis Date  . Headache(784.0)   . ADHD (attention deficit hyperactivity disorder)     denies taking meds  . Bipolar disorder     Pt reports that this runs in his family but he has never been officially diagnosed. Pt reports that his mom used to give him Seroquel as a child but "made me feel like a zombie so I wouldn't take it".  . Pneumonia     Medications:  Prescriptions prior to admission  Medication Sig Dispense Refill  . HYDROcodone-acetaminophen (NORCO/VICODIN) 5-325 MG per tablet Take 1-2 tablets by mouth every 6 (six) hours as needed for moderate pain. Pt. Reports that he destroyed this medicine , flushed down toilet        Assessment: 23 year old male, POD #0 R ACL repair.  Initiating warfarin for VTE prophylaxis.  CBC stable, baseline INR 1.17, no signs or symptoms of bleeding.   Goal of Therapy:  INR 2-3 Monitor platelets by anticoagulation protocol: Yes   Plan:  Warfarin 7.5 mg po x1 Daily INR Monitor for s/sx bleeding Will need education   Agapito Games, PharmD, BCPS Clinical Pharmacist Pager: [phone] 07/03/2013 7:51 PM

## 2013-07-03 NOTE — Anesthesia Preprocedure Evaluation (Addendum)
Anesthesia Evaluation  Patient identified by MRN, date of birth, ID band Patient awake    Reviewed: Allergy & Precautions, H&P , NPO status , Patient's Chart, lab work & pertinent test results  Airway Mallampati: II TM Distance: >3 FB Neck ROM: Full    Dental  (+) Teeth Intact, Dental Advisory Given   Pulmonary          Cardiovascular negative cardio ROS      Neuro/Psych  Headaches,    GI/Hepatic negative GI ROS, Neg liver ROS,   Endo/Other  negative endocrine ROS  Renal/GU negative Renal ROS     Musculoskeletal negative musculoskeletal ROS (+)   Abdominal   Peds  Hematology negative hematology ROS (+)   Anesthesia Other Findings   Reproductive/Obstetrics negative OB ROS                           Anesthesia Physical Anesthesia Plan  ASA: I  Anesthesia Plan: General   Post-op Pain Management:    Induction: Intravenous  Airway Management Planned: Oral ETT  Additional Equipment:   Intra-op Plan:   Post-operative Plan: Extubation in OR  Informed Consent: I have reviewed the patients History and Physical, chart, labs and discussed the procedure including the risks, benefits and alternatives for the proposed anesthesia with the patient or authorized representative who has indicated his/her understanding and acceptance.   Dental advisory given  Plan Discussed with: Anesthesiologist and Surgeon  Anesthesia Plan Comments:        Anesthesia Quick Evaluation

## 2013-07-04 LAB — PROTIME-INR
INR: 1.23 (ref 0.00–1.49)
PROTHROMBIN TIME: 15.2 s (ref 11.6–15.2)

## 2013-07-04 MED ORDER — WARFARIN SODIUM 5 MG PO TABS: 5.0000 mg | ORAL_TABLET | Freq: Every day | ORAL | Status: DC

## 2013-07-04 MED ORDER — OXYCODONE HCL 5 MG PO TABS: 5.0000 mg | ORAL_TABLET | ORAL | Status: DC | PRN

## 2013-07-04 MED ORDER — METHOCARBAMOL 500 MG PO TABS: 500.0000 mg | ORAL_TABLET | Freq: Four times a day (QID) | ORAL | Status: DC | PRN

## 2013-07-04 NOTE — Progress Notes (Signed)
Subjective: Pt stable - c/o leg pain right   Objective: Vital signs in last 24 hours: Temp:  [97.4 F (36.3 C)-98.3 F (36.8 C)] 98.3 F (36.8 C) (05/27 0528) Pulse Rate:  [46-89] 58 (05/27 0528) Resp:  [14-20] 16 (05/27 0528) BP: (119-128)/(58-71) 121/67 mmHg (05/27 0528) SpO2:  [98 %-100 %] 98 % (05/27 0528) Weight:  [76.2 kg (167 lb 15.9 oz)] 76.2 kg (167 lb 15.9 oz) (05/26 1023)  Intake/Output from previous day: 05/26 0701 - 05/27 0700 In: 2450 [I.V.:2450] Out: 250 [Urine:200; Blood:50] Intake/Output this shift:    Exam:  Sensation intact distally Intact pulses distally Dorsiflexion/Plantar flexion intact  Labs: No results found for this basename: HGB,  in the last 72 hours No results found for this basename: WBC, RBC, HCT, PLT,  in the last 72 hours No results found for this basename: NA, K, CL, CO2, BUN, CREATININE, GLUCOSE, CALCIUM,  in the last 72 hours  Recent Labs  07/03/13 2120 07/04/13 0530  INR 1.17 1.23    Assessment/Plan: Plan dc today after pt and dressing change nwb rle   Corrie Mckusick Dean 07/04/2013, 10:16 AM

## 2013-07-04 NOTE — OR Nursing (Signed)
Late entry to change procedure to Fibular collateral ligament reconstruction by Timoteo Expose, RN

## 2013-07-04 NOTE — Op Note (Signed)
NAME:  Sherron MondayREYNOLDS, Gregory             ACCOUNT NO.:  [id number]  MEDICAL RECORD NO.:  [id number]  LOCATION:  5N06C                        FACILITY:  [facility]  PHYSICIAN:  Burnard BuntingG. Scott Sheryl Towell, M.D.    DATE OF BIRTH:  [birth year]  DATE OF PROCEDURE: DATE OF DISCHARGE:                              OPERATIVE REPORT   PREOPERATIVE DIAGNOSIS:  Right knee anterior cruciate ligament tear, posterolateral rotatory instability and fibular collateral ligament laxity.  POSTOPERATIVE DIAGNOSES:  Right knee anterior cruciate ligament tear, posterolateral rotatory instability, fibular collateral ligament laxity and medial meniscal tear.  PROCEDURES:  Right knee diagnostic modified arthroscopy with ACL reconstruction using contralateral hamstring semitendinosus autograft, posterolateral corner reconstruction, FCL reconstruction using ipsilateral semitendinosus hamstring autograft, partial medial meniscectomy.  SURGEON:  Burnard BuntingG. Scott Coral Timme, M.D.  ASSISTANT:  Skip MayerBlair Roberts, PA-C  ANESTHESIA:  General.  INDICATIONS:  The patient with right knee instability, presents for operative management after explanation of risks and benefits.  OPERATIVE FINDINGS: 1. Examination under anesthesia, right and left knees demonstrated     about 5-6 degrees of hyperextension.  The patient had ACL laxity on     the right with about 20 degrees more external rotation at 15     degrees of flexion on the right compared to the left.  Medial     collateral ligament intact bilaterally.  The patient did have     hyperextension bilaterally, no instability to varus stress,     however, and about 10 degrees of flexion.  The patient had about 4-     5 mm of opening on the right compared to about 2 mm of opening on     the left.  PCL was intact bilaterally. 2. Diagnostic modified arthroscopy:     a.     Intact patellofemoral compartment.     b.     Intact lateral compartment and articular cartilage, and      meniscus.     c.     Intact  PCL, torn ACL.     d.     Tear of the posterior medial meniscus involving about 60%      anterior-posterior width of the meniscus over 1 cm area.  This was      in the closer to the white zone, then the red-white zone, and      thus, it was resected.     e.     Intact articular cartilage on the medial side.  PROCEDURE IN DETAIL:  The patient was brought to the operating room where general anesthesia was induced.  Time-out was called.  Both legs were prescrubbed with alcohol and Betadine, which was allowed to air dry, prepped with DuraPrep solution and draped in a sterile manner. Left leg was elevated and exsanguinated with Esmarch wrap.  Tourniquet was inflated to 300 mmHg for 7 minutes.  Incision was made over the pes bursa tendons.  Semitendinosus tendon was harvested.  Tourniquet was released.  Bleeding points were encountered and controlled using electrocautery.  Thorough irrigation performed.  This incision was closed using 2-0 Vicryl and 3-0 Prolene.  On the back table, this tendon was prepared by Skip MayerBlair Roberts in a quadrupled semitendinosus fashion. Graft was  9 mm one end and 8.5 mm on the other.  This was done with dual Endobutton technique.  Confirmed with graft preparation.  Right leg was prepared.  An incision was made over the pes bursa tendons and the semitendinosus was again harvested and it was also prepared in the similar fashion on one end.  This was going to be the tendon used for reconstruction of the posterolateral corner of FCL.  At this time, anterior-inferolateral and anterior-inferomedial portals were established.  Diagnostic arthroscopy was performed.  Patellofemoral compartment intact, lateral compartment intact, articular cartilage, and meniscus.  ACL stump was debrided.  Notchplasty performed.  PCL was intact.  Medial compartment inspected.  Articular cartilage intact. Medial meniscal tear was identified and resected back to the stable with combination of  basket punch and shaver.  At this time, femoral tunnel was drilled inside out at the 9 o'clock position using flip cutter. Tibial tunnel drilled in similar manner through the native ACL footprint.  Exposure then was performed on the lateral side and incision was made on the lateral aspect of the knee.  Skin and subcutaneous tissue were sharply divided.  Peroneal nerve was identified at the distal aspect of the incision and traced proximally and distally to its insertion and was tracked proximally and distally.  The peroneal nerve was protected at all times during the remaining portion of the case. Incision was then made over the midportion of the medial condyle, the iliotibial band was split.  Capsule was incised.  Anterior portion of the popliteal sulcus was identified.  FCL attachment site was also identified at this time.  After exposure, the ACL graft was passed and tensioned in extension with good isometry and good stability restored. At this time also, StimuBlast was placed into the sockets prior to graft passage.  Next, the fibula was drilled from anterolateral to posteromedial with the peroneal nerve protected.  This was drilled with a 5.5-mm reamer.  The sockets were then drilled on the lateral femur for locking of the semitendinosus tendon.  Initially, the graft was passed through the fibula, then one end was placed underneath the iliotibial band and secured in the anterior portion of the popliteal sulcus. Approximately 18 mm posterior and proximal was the attachment site of the FCL.  Internal rotation was placed on the leg and the graft was then placed with good fixation achieved with another swivel lock.  This gave excellent rotational stability, excellent varus stability, excellent stability to varus stress.  Tourniquet was released at this time after 125 minutes.  Irrigation performed of the knee and open incision. Portals were closed using 3-0 nylon, harvest sites on the  right leg closed using 2-0 Vicryl and 3-0 Prolene.  The incision laterally was closed by using #1 Vicryl suture, reapproximated the iliotibial band, split, 0 Vicryl suture, 2-0 Vicryl suture and running 3-0 pullout Prolene.  Solution of Marcaine, morphine, clonidine was injected to the knee.  The patient was placed into bulky dressing and knee immobilizer. Skip Mayer' assistance required at all times during the case for retraction, neurovascular structures opening and closing, graft preparation, and her assistance was a medical necessity.     Burnard Bunting, M.D.     GSD/MEDQ  D:  07/03/2013  T:  07/04/2013  Job:  [id number]

## 2013-07-04 NOTE — Progress Notes (Signed)
Orthopedic Tech Progress Note Patient Details:  Gregory [name] 09/17/1990 [id number]  Ortho Devices Type of Ortho Device: Crutches Ortho Device/Splint Interventions: Application   Mickie Bail Cammer 07/04/2013, 3:02 PM

## 2013-07-04 NOTE — Anesthesia Postprocedure Evaluation (Signed)
  Anesthesia Post-op Note  Patient: Gregory [name]  Procedure(s) Performed: Procedure(s) with comments: RECONSTRUCTION ANTERIOR CRUCIATE LIGAMENT (ACL) WITH HAMSTRING GRAFT (Right) - RIGHT KNEE ANTERIOR CRUCIATE LIGAMENT RECONSTRUCTION, POSTERIOR CRUCIATE LIGAMENT RECONSTRUCTION, MENISCAL  DEBRIDEMENT, HAMSTRING AUTOGRAFT. RECONSTRUCTION OF FIBULAR COLLATERAL LIGAMENT (Right)  Patient Location: PACU  Anesthesia Type:General and Regional  Level of Consciousness: awake and alert   Airway and Oxygen Therapy: Patient Spontanous Breathing  Post-op Pain: moderate  Post-op Assessment: Post-op Vital signs reviewed, Patient's Cardiovascular Status Stable, Respiratory Function Stable, Patent Airway, No signs of Nausea or vomiting and Pain level controlled  Post-op Vital Signs: Reviewed and stable  Last Vitals:  Filed Vitals:   07/04/13 0528  BP: 121/67  Pulse: 58  Temp: 36.8 C  Resp: 16    Complications: No apparent anesthesia complications

## 2013-07-04 NOTE — Evaluation (Signed)
Occupational Therapy Evaluation Patient Details Name: Gregory [name] MRN: [id number] DOB: 10/18/1990 Today's Date: 07/04/2013    History of Present Illness pt presents with R ACL Reconstruction.     Clinical Impression   Pt admitted with the above diagnoses and presents with below problem list. Pt will benefit from continued acute OT to address the below listed deficits and maximize independence with basic ADLs prior to d/c home.     Follow Up Recommendations  No OT follow up    Equipment Recommendations  3 in 1 bedside comode    Recommendations for Other Services       Precautions / Restrictions Precautions Precautions: Fall Required Braces or Orthoses: Knee Immobilizer - Right Knee Immobilizer - Right: On when out of bed or walking Restrictions Weight Bearing Restrictions: Yes RLE Weight Bearing: Non weight bearing      Mobility Bed Mobility               General bed mobility comments: not assessed - pt in recliner at start and end of session  Transfers Overall transfer level: Needs assistance Equipment used: Crutches Transfers: Sit to/from Stand Sit to Stand: Min guard         General transfer comment: increased pain with transfer    Balance Overall balance assessment: Needs assistance Sitting-balance support: No upper extremity supported;Feet supported Sitting balance-Leahy Scale: Good     Standing balance support: Bilateral upper extremity supported;During functional activity Standing balance-Leahy Scale: Poor                              ADL Overall ADL's : Needs assistance/impaired Eating/Feeding: Set up;Sitting   Grooming: Set up;Sitting   Upper Body Bathing: Supervision/ safety;Sitting   Lower Body Bathing: Sit to/from stand;Min guard   Upper Body Dressing : Supervision/safety;Sitting   Lower Body Dressing: With adaptive equipment;Min guard   Toilet Transfer: Min guard;Ambulation (crutches; 3n1 over toilet)    Toileting- Clothing Manipulation and Hygiene: Min guard;Sit to/from stand   Tub/ Shower Transfer: Min guard;Ambulation;3 in 1 (crutches)   Functional mobility during ADLs: Min guard (crutches) General ADL Comments: Educated pt and techniques and AE for safe completion of ADLs. Pt able to correctly state NWB status of RLE.     Vision                     Perception     Praxis      Pertinent Vitals/Pain 7/10 pain, provided ice pack and wet washcloth for comfort at end of session. Pt reports ongoing difficulty with pain management during stay.     Hand Dominance     Extremity/Trunk Assessment Upper Extremity Assessment Upper Extremity Assessment: Overall WFL for tasks assessed   Lower Extremity Assessment Lower Extremity Assessment: Defer to PT evaluation    Cervical / Trunk Assessment Cervical / Trunk Assessment: Normal   Communication Communication Communication: No difficulties   Cognition Arousal/Alertness: Awake/alert Behavior During Therapy: WFL for tasks assessed/performed Overall Cognitive Status: Within Functional Limits for tasks assessed                     General Comments       Exercises       Shoulder Instructions      Home Living Family/patient expects to be discharged to:: Private residence Living Arrangements: Spouse/significant other Available Help at Discharge: Family;Friend(s);Available 24 hours/day Type of Home: House Home Access: Stairs to enter  Entrance Stairs-Number of Steps: 4   Home Layout: One level     Bathroom Shower/Tub: Tub/shower unit;Curtain Shower/tub characteristics: Engineer, building services: Standard     Home Equipment: None          Prior Functioning/Environment Level of Independence: Independent             OT Diagnosis: Acute pain   OT Problem List: Impaired balance (sitting and/or standing);Decreased knowledge of use of DME or AE;Decreased knowledge of precautions;Pain   OT  Treatment/Interventions: Self-care/ADL training;Therapeutic exercise;DME and/or AE instruction;Therapeutic activities;Patient/family education;Balance training    OT Goals(Current goals can be found in the care plan section) Acute Rehab OT Goals Patient Stated Goal: not stated OT Goal Formulation: With patient Time For Goal Achievement: 07/11/13 Potential to Achieve Goals: Good ADL Goals Pt Will Perform Lower Body Bathing: with supervision;with adaptive equipment;sit to/from stand Pt Will Perform Lower Body Dressing: with supervision;with adaptive equipment;sit to/from stand Pt Will Transfer to Toilet: with supervision;ambulating (3n1 over toilet) Pt Will Perform Toileting - Clothing Manipulation and hygiene: with supervision;sit to/from stand Pt Will Perform Tub/Shower Transfer: with supervision;ambulating;3 in 1 (crutches)  OT Frequency: Min 2X/week   Barriers to D/C:            Co-evaluation              End of Session Equipment Utilized During Treatment: Gait belt;Right knee immobilizer;Other (comment) (crutches) Nurse Communication: Other (comment) (pt request to have IV removed)  Activity Tolerance: Patient limited by pain Patient left: in chair;with call bell/phone within reach;with family/visitor present   Time: 1101-1117 OT Time Calculation (min): 16 min Charges:  OT General Charges $OT Visit: 1 Procedure OT Evaluation $Initial OT Evaluation Tier I: 1 Procedure OT Treatments $Self Care/Home Management : 8-22 mins G-Codes: OT G-codes **NOT FOR INPATIENT CLASS** Functional Assessment Tool Used: clinical judgement Functional Limitation: Self care Self Care Current Status (O0600): At least 1 percent but less than 20 percent impaired, limited or restricted Self Care Goal Status (K5997): At least 1 percent but less than 20 percent impaired, limited or restricted Self Care Discharge Status [phone]): At least 1 percent but less than 20 percent impaired, limited or  restricted  Pilar Grammes 07/04/2013, 11:43 AM

## 2013-07-04 NOTE — Progress Notes (Signed)
Physical Therapy Evaluation Patient Details Name: Gregory [name] MRN: [id number] DOB: 31-Jul-1990 Today's Date: 07/04/2013   History of Present Illness  pt presents with R ACL Reconstruction.    Clinical Impression  Pt mobility limited by pain, but demos good safety with mobility.  Pt will have help from family at D/C.  No further acute PT needs at this time.  Will sign off.      Follow Up Recommendations No PT follow up;Supervision for mobility/OOB    Equipment Recommendations  Crutches    Recommendations for Other Services       Precautions / Restrictions Precautions Precautions: Fall Required Braces or Orthoses: Knee Immobilizer - Right Knee Immobilizer - Right: On when out of bed or walking Restrictions Weight Bearing Restrictions: Yes RLE Weight Bearing: Non weight bearing      Mobility  Bed Mobility Overal bed mobility: Needs Assistance Bed Mobility: Supine to Sit     Supine to sit: Min assist     General bed mobility comments: not assessed - pt in recliner at start and end of session  Transfers Overall transfer level: Needs assistance Equipment used: Crutches Transfers: Sit to/from Stand Sit to Stand: Min guard         General transfer comment: increased pain with transfer  Ambulation/Gait Ambulation/Gait assistance: Supervision Ambulation Distance (Feet): 75 Feet Assistive device: Crutches Gait Pattern/deviations: Step-to pattern     General Gait Details: cues for safe use of crutches adn pacing.    Stairs Stairs: Yes Stairs assistance: Min guard Stair Management: One rail Left;With crutches;Forwards Number of Stairs: 2 General stair comments: cues for use of L rail and one crutch.  pt ed on positioning of R LE.    Wheelchair Mobility    Modified Rankin (Stroke Patients Only)       Balance Overall balance assessment: Needs assistance Sitting-balance support: No upper extremity supported;Feet supported Sitting balance-Leahy Scale:  Good     Standing balance support: Bilateral upper extremity supported;During functional activity Standing balance-Leahy Scale: Poor                               Pertinent Vitals/Pain 8/10 R LE.  Premedicated.      Home Living Family/patient expects to be discharged to:: Private residence Living Arrangements: Spouse/significant other Available Help at Discharge: Family;Friend(s);Available 24 hours/day Type of Home: House Home Access: Stairs to enter   [employer] of Steps: 4 Home Layout: One level Home Equipment: None      Prior Function Level of Independence: Independent               Hand Dominance        Extremity/Trunk Assessment   Upper Extremity Assessment: Overall WFL for tasks assessed           Lower Extremity Assessment: Defer to PT evaluation RLE Deficits / Details: Limited 2/2 pain and guarding.      Cervical / Trunk Assessment: Normal  Communication   Communication: No difficulties  Cognition Arousal/Alertness: Awake/alert Behavior During Therapy: WFL for tasks assessed/performed Overall Cognitive Status: Within Functional Limits for tasks assessed                      General Comments      Exercises        Assessment/Plan    PT Assessment Patent does not need any further PT services  PT Diagnosis     PT Problem  List    PT Treatment Interventions     PT Goals (Current goals can be found in the Care Plan section) Acute Rehab PT Goals Patient Stated Goal: not stated PT Goal Formulation: No goals set, d/c therapy    Frequency     Barriers to discharge        Co-evaluation               End of Session Equipment Utilized During Treatment: Gait belt;Right knee immobilizer Activity Tolerance: Patient limited by pain Patient left: in chair;with call bell/phone within reach;with family/visitor present Nurse Communication: Mobility status    Functional Assessment Tool Used: Clinical  Judgement Functional Limitation: Mobility: Walking and moving around Mobility: Walking and Moving Around Current Status ([id number](G8978): At least 1 percent but less than 20 percent impaired, limited or restricted Mobility: Walking and Moving Around Goal Status [phone](G8979): At least 1 percent but less than 20 percent impaired, limited or restricted Mobility: Walking and Moving Around Discharge Status [phone](G8980): At least 1 percent but less than 20 percent impaired, limited or restricted    Time: 1019-1054 PT Time Calculation (min): 35 min   Charges:   PT Evaluation $Initial PT Evaluation Tier I: 1 Procedure PT Treatments $Gait Training: 23-37 mins   PT G Codes:   Functional Assessment Tool Used: Clinical Judgement Functional Limitation: Mobility: Walking and moving around    W. R. BerkleyMegan F Zayvier Caravello, [place] CarolinaPT [phone] 07/04/2013, 1:58 PM

## 2013-07-05 ENCOUNTER — Encounter (HOSPITAL_COMMUNITY): Payer: Self-pay | Admitting: Orthopedic Surgery

## 2013-07-28 NOTE — Discharge Summary (Signed)
Physician Discharge Summary  Patient ID: Gregory [name] T Bellard MRN: [id number] DOB/AGE: 22/06/1990 22 y.o.  Admit date: 07/03/2013 Discharge date:07/04/2013 Admission Diagnoses:  Active Problems:   ACL tear   Discharge Diagnoses:  Same  Surgeries: Procedure(s): RECONSTRUCTION ANTERIOR CRUCIATE LIGAMENT (ACL) WITH HAMSTRING GRAFT RECONSTRUCTION OF FIBULAR COLLATERAL LIGAMENT on 07/03/2013   Consultants:    Discharged Condition: Stable  Hospital Course: Gregory [name] T Livers is an 23 y.o. male who was admitted 07/03/2013 with a chief complaint of knee pain, and found to have a diagnosis of kneemulti  ligament injury.  They were brought to the operating room on 07/03/2013 and underwent the above named procedures.Tolerated it well and was dced home pod 1 tdwb for transfers with CPM for home use    Antibiotics given:  Anti-infectives   Start     Dose/Rate Route Frequency Ordered Stop   07/03/13 2100  ceFAZolin (ANCEF) IVPB 1 g/50 mL premix     1 g 100 mL/hr over 30 Minutes Intravenous 3 times per day 07/03/13 1931 07/04/13 0645   07/03/13 0600  ceFAZolin (ANCEF) IVPB 2 g/50 mL premix     2 g 100 mL/hr over 30 Minutes Intravenous On call to O.R. 07/02/13 1314 07/03/13 1650    .  Recent vital signs:  Filed Vitals:   07/04/13 0528  BP: 121/67  Pulse: 58  Temp: 98.3 F (36.8 C)  Resp: 16    Recent laboratory studies:  Results for orders placed during the hospital encounter of 07/03/13  PROTIME-INR      Result Value Ref Range   Prothrombin Time 15.2  11.6 - 15.2 seconds   INR 1.23  0.00 - 1.49  PROTIME-INR      Result Value Ref Range   Prothrombin Time 14.7  11.6 - 15.2 seconds   INR 1.17  0.00 - 1.49    Discharge Medications:     Medication List    STOP taking these medications       HYDROcodone-acetaminophen 5-325 MG per tablet  Commonly known as:  NORCO/VICODIN      TAKE these medications       methocarbamol 500 MG tablet  Commonly known as:  ROBAXIN  Take 1  tablet (500 mg total) by mouth every 6 (six) hours as needed for muscle spasms.     oxyCODONE 5 MG immediate release tablet  Commonly known as:  Oxy IR/ROXICODONE  Take 1-2 tablets (5-10 mg total) by mouth every 3 (three) hours as needed for breakthrough pain.     warfarin 5 MG tablet  Commonly known as:  COUMADIN  Take 1 tablet (5 mg total) by mouth daily.        Diagnostic Studies: No results found.  Disposition: 01-Home or Self Care      Discharge Instructions   Call MD / Call 911    Complete by:  As directed   If you experience chest pain or shortness of breath, CALL 911 and be transported to the hospital emergency room.  If you develope a fever above 101 F, pus (white drainage) or increased drainage or redness at the wound, or calf pain, call your surgeon's office.     Constipation Prevention    Complete by:  As directed   Drink plenty of fluids.  Prune juice may be helpful.  You may use a stool softener, such as Colace (over the counter) 100 mg twice a day.  Use MiraLax (over the counter) for constipation as needed.     Diet -  low sodium heart healthy    Complete by:  As directed      Discharge instructions    Complete by:  As directed   Non weight bearing right leg cpm 1 hour 3 times a day Keep incision dry Work on keeping leg straight     Increase activity slowly as tolerated    Complete by:  As directed               Signed: DEAN,GREGORY SCOTT 07/28/2013, 9:10 AM

## 2014-02-12 ENCOUNTER — Emergency Department (INDEPENDENT_AMBULATORY_CARE_PROVIDER_SITE_OTHER)
Admission: EM | Admit: 2014-02-12 | Discharge: 2014-02-12 | Disposition: A | Discharge status: Home / Self Care | Payer: Managed Care, Other (non HMO) | Source: Home / Self Care | Attending: Family Medicine | Admitting: Family Medicine

## 2014-02-12 ENCOUNTER — Encounter (HOSPITAL_COMMUNITY): Payer: Self-pay | Admitting: *Deleted

## 2014-02-12 DIAGNOSIS — J111 Influenza due to unidentified influenza virus with other respiratory manifestations: Secondary | ICD-10-CM

## 2014-02-12 DIAGNOSIS — R69 Illness, unspecified: Principal | ICD-10-CM

## 2014-02-12 LAB — POCT RAPID STREP A: Streptococcus, Group A Screen (Direct): NEGATIVE

## 2014-02-12 MED ORDER — OSELTAMIVIR PHOSPHATE 75 MG PO CAPS: 75.0000 mg | ORAL_CAPSULE | Freq: Two times a day (BID) | ORAL | Status: DC

## 2014-02-12 NOTE — ED Provider Notes (Addendum)
CSN: [id number]     Arrival date & time 02/12/14  1347 History   First MD Initiated Contact with Patient 02/12/14 1420     Chief Complaint  Patient presents with  . Sore Throat   (Consider location/radiation/quality/duration/timing/severity/associated sxs/prior Treatment) Patient is a 24 y.o. male presenting with pharyngitis. The history is provided by the patient.  Sore Throat This is a new problem. The current episode started more than 2 days ago. The problem has been gradually worsening. Pertinent negatives include no chest pain, no abdominal pain, no headaches and no shortness of breath. Associated symptoms comments: No flu vaccine this season..    Past Medical History  Diagnosis Date  . Headache(784.0)   . ADHD (attention deficit hyperactivity disorder)     denies taking meds  . Bipolar disorder     Pt reports that this runs in his family but he has never been officially diagnosed. Pt reports that his mom used to give him Seroquel as a child but "made me feel like a zombie so I wouldn't take it".  . Pneumonia    Past Surgical History  Procedure Laterality Date  . Knee surgery    . 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 Pt reports that heart lining of cow was then attached so that tissue would expand as pt grew. Dr. Trey Sailors performed surgery.  . Fracture surgery  2010    nasal fracture  . Anterior cruciate ligament repair Right 07/03/2013    Procedure: RECONSTRUCTION ANTERIOR CRUCIATE LIGAMENT (ACL) WITH HAMSTRING GRAFT;  Surgeon: [name], MD;  Location[facility] OR;  Service: Orthopedics;  Laterality: Right;  RIGHT KNEE ANTERIOR CRUCIATE LIGAMENT RECONSTRUCTION, POSTERIOR CRUCIATE LIGAMENT RECONSTRUCTION, MENISCAL  DEBRIDEMENT, HAMSTRING AUTOGRAFT.  Marland Kitchen Posterior cruciate ligament reconstruction Right 07/03/2013    Procedure: RECONSTRUCTION OF FIBULAR COLLATERAL LIGAMENT;  Surgeon: [name], MD;  Location: [facility] OR;  Service: Orthopedics;  Laterality: Right;   History reviewed. No pertinent family history. History  Substance Use Topics  . Smoking status: Never Smoker   . Smokeless tobacco: Never Used  . Alcohol Use: No    Review of Systems  Constitutional: Positive for fever and chills.  HENT: Positive for postnasal drip and sore throat.   Respiratory: Positive for cough. Negative for shortness of breath.   Cardiovascular: Negative for chest pain.  Gastrointestinal: Negative.  Negative for nausea, vomiting and abdominal pain.  Musculoskeletal: Positive for myalgias.  Skin: Negative.   Neurological: Negative for headaches.    Allergies  Review of patient's allergies indicates no known allergies.  Home Medications   Prior to Admission medications   Medication Sig Start Date End Date Taking? Authorizing Provider  methocarbamol (ROBAXIN) 500 MG tablet Take 1 tablet (500 mg total) by mouth every 6 (six) hours as needed for muscle spasms. 07/04/13   [name], MD  oseltamivir (TAMIFLU) 75 MG capsule Take 1 capsule (75 mg total) by mouth every 12 (twelve) hours. Take all of medication. 02/12/14   Linna Hoff, MD  oxyCODONE (OXY IR/ROXICODONE) 5 MG immediate release tablet Take 1-2 tablets (5-10 mg total) by mouth every 3 (three) hours as needed for breakthrough pain. 07/04/13   [name], MD  warfarin (COUMADIN) 5 MG tablet Take 1 tablet (5 mg total) by mouth daily. 07/04/13   [name], MD   BP 91/54 mmHg  Pulse 88  Temp(Src) 98.3 F (36.8 C) (Oral)  Resp 16  SpO2 96% Physical Exam  Constitutional: He is oriented to person, place, and time. He appears well-developed and well-nourished.  HENT:  Head: Normocephalic.  Right Ear: External ear normal.  Left Ear: External ear normal.  Mouth/Throat: Oropharynx is clear and moist.  Eyes: Pupils are equal, round, and reactive to light.  Neck: Normal range of motion. Neck supple.   Cardiovascular: Normal rate, regular rhythm, normal heart sounds and intact distal pulses.   Pulmonary/Chest: Effort normal and breath sounds normal.  Abdominal: Soft. Bowel sounds are normal. There is no tenderness.  Lymphadenopathy:    He has no cervical adenopathy.  Neurological: He is alert and oriented to person, place, and time.  Skin: Skin is warm and dry.  Nursing note and vitals reviewed.   ED Course  Procedures (including critical care time) Labs Review Labs Reviewed  POCT RAPID STREP A ([facility]RG CARE ONLY)    Imaging Review No results found.   MDM   1. Influenza-like illness        Linna HoffJames D Kindl, MD 02/12/14 1440  Linna HoffJames D Kindl, MD 02/12/14 [phone]

## 2014-02-12 NOTE — ED Notes (Signed)
Pt  Reports  Symptoms  Of  sorethroat     With  Stuffy  Nose   And  Congestion   With  Body  Aches           X 2  Days            he   Is  Awake   And  Alert  And  Oriented

## 2014-02-14 LAB — CULTURE, GROUP A STREP

## 2015-02-25 ENCOUNTER — Emergency Department (INDEPENDENT_AMBULATORY_CARE_PROVIDER_SITE_OTHER)
Admission: EM | Admit: 2015-02-25 | Discharge: 2015-02-25 | Disposition: A | Discharge status: Home / Self Care | Payer: 59 | Source: Home / Self Care | Attending: Emergency Medicine | Admitting: Emergency Medicine

## 2015-02-25 ENCOUNTER — Encounter (HOSPITAL_COMMUNITY): Payer: Self-pay | Admitting: Emergency Medicine

## 2015-02-25 DIAGNOSIS — J3489 Other specified disorders of nose and nasal sinuses: Secondary | ICD-10-CM

## 2015-02-25 DIAGNOSIS — J069 Acute upper respiratory infection, unspecified: Secondary | ICD-10-CM | POA: Diagnosis not present

## 2015-02-25 NOTE — ED Provider Notes (Signed)
CSN: [id number]     Arrival date & time 02/25/15  1318 History   First MD Initiated Contact with Patient 02/25/15 1423     Chief Complaint  Patient presents with  . Bronchitis   (Consider location/radiation/quality/duration/timing/severity/associated sxs/prior Treatment) HPI Comments: 25 year old male complaining of a cough and PND for a couple days. He states that his relatives have been diagnosed with bronchitis so he is afraid that he now has it. He denies shortness of breath. The only fever was stated to be 101.1 last PM. He is afebrile today and has been afebrile previously. Denies shortness of breath. He does have nasal congestion. He does not smoke nor does he have asthma.   Past Medical History  Diagnosis Date  . Headache(784.0)   . ADHD (attention deficit hyperactivity disorder)     denies taking meds  . Bipolar disorder (HCC)     Pt reports that this runs in his family but he has never been officially diagnosed. Pt reports that his mom used to give him Seroquel as a child but "made me feel like a zombie so I wouldn't take it".  . Pneumonia    Past Surgical History  Procedure Laterality Date  . Knee surgery    . 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 Pt reports that heart lining of cow was then attached so that tissue would expand as pt grew. Dr. Trey Sailors performed surgery.  . Fracture surgery  2010    nasal fracture  . Anterior cruciate ligament repair Right 07/03/2013    Procedure: RECONSTRUCTION ANTERIOR CRUCIATE LIGAMENT (ACL) WITH HAMSTRING GRAFT;  Surgeon: [name], MD;  Location: [facility] OR;  Service: Orthopedics;  Laterality: Right;  RIGHT KNEE ANTERIOR CRUCIATE LIGAMENT RECONSTRUCTION, POSTERIOR CRUCIATE LIGAMENT RECONSTRUCTION, MENISCAL  DEBRIDEMENT, HAMSTRING AUTOGRAFT.  Marland Kitchen Posterior cruciate ligament reconstruction Right 07/03/2013    Procedure: RECONSTRUCTION OF FIBULAR COLLATERAL  LIGAMENT;  Surgeon: [name], MD;  Location: [facility] OR;  Service: Orthopedics;  Laterality: Right;   No family history on file. Social History  Substance Use Topics  . Smoking status: Never Smoker   . Smokeless tobacco: Never Used  . Alcohol Use: No    Review of Systems  Constitutional: Negative.  Negative for activity change.  HENT: Positive for congestion, postnasal drip and rhinorrhea. Negative for ear pain.   Respiratory: Positive for cough. Negative for shortness of breath and wheezing.   Cardiovascular: Negative for chest pain.  Gastrointestinal: Negative.   Genitourinary: Negative.   Musculoskeletal:       As per HPI  Skin: Negative.   Neurological: Negative for dizziness, weakness, numbness and headaches.    Allergies  Review of patient's allergies indicates no known allergies.  Home Medications   Prior to Admission medications   Medication Sig Start Date End Date Taking? Authorizing Provider  methocarbamol (ROBAXIN) 500 MG tablet Take 1 tablet (500 mg total) by mouth every 6 (six) hours as needed for muscle spasms. Patient not taking: Reported on 02/25/2015 07/04/13   [name], MD  oseltamivir (TAMIFLU) 75 MG capsule Take 1 capsule (75 mg total) by mouth every 12 (twelve) hours. Take all of medication. Patient not taking: Reported on 02/25/2015 02/12/14   Linna Hoff, MD  oxyCODONE (OXY IR/ROXICODONE) 5 MG immediate release tablet Take 1-2 tablets (5-10 mg total) by mouth every 3 (three) hours as needed for breakthrough pain. Patient  not taking: Reported on 02/25/2015 07/04/13   [name], MD  warfarin (COUMADIN) 5 MG tablet Take 1 tablet (5 mg total) by mouth daily. Patient not taking: Reported on 02/25/2015 07/04/13   [name], MD   Meds Ordered and Administered this Visit  Medications - No data to display  BP 103/52 mmHg  Pulse 76  Temp(Src) 98.4 F (36.9 C) (Oral)  Resp 16  SpO2 97% No data found.   Physical Exam    Constitutional: He is oriented to person, place, and time. He appears well-developed and well-nourished. No distress.  HENT:  Bilateral TMs are normal Oropharynx with mild erythema, cobblestoning and moderate amount of clear PND. No exudates. No swelling.  Eyes: Conjunctivae and EOM are normal.  Neck: Normal range of motion. Neck supple.  Cardiovascular: Normal rate, regular rhythm and normal heart sounds.   Pulmonary/Chest: Effort normal and breath sounds normal. No respiratory distress. He has no wheezes. He has no rales.  Lungs are perfectly clear. Good expansion. Inspiratory and expiratory phases are normal. No adventitious sounds.  Musculoskeletal: Normal range of motion. He exhibits no edema.  Lymphadenopathy:    He has no cervical adenopathy.  Neurological: He is alert and oriented to person, place, and time.  Skin: Skin is warm and dry. No rash noted.  Psychiatric: He has a normal mood and affect.  Nursing note and vitals reviewed.   ED Course  Procedures (including critical care time)  Labs Review Labs Reviewed - No data to display  Imaging Review No results found.   Visual Acuity Review  Right Eye Distance:   Left Eye Distance:   Bilateral Distance:    Right Eye Near:   Left Eye Near:    Bilateral Near:         MDM   1. URI (upper respiratory infection)   2. Sinus drainage    For drainage going in the back of your throat and sore throat recommend taking either Allegra, Zyrtec or Claritin. For stronger medication you may take Chlor-Trimeton that this may call some drowsiness. Cepacol lozenges for sore throat Ibuprofen 600 mg every 6 hours as needed for sore throat and discomfort. Drink plenty of fluids and stay well-hydrated Most upper respiratory infections (URIs) are a viral infection of the air passages leading to the lungs. A URI affects the nose, throat, and upper air passages. The most common type of URI is nasopharyngitis and is typically referred  to as "the common cold." URIs run their course and usually go away on their own. Most of the time, a URI does not require medical attention    Hayden Rasmussen, NP 02/25/15 1513

## 2015-02-25 NOTE — ED Notes (Addendum)
Patient has concerns for bronchitis.  Patient reports symptoms for 2 days of coughing, center of chest soreness, frequently clearing throat, fever ( 101 temp last night) and green phlegm.  Patient lives with family members that have been sick/bronchitis.

## 2015-02-25 NOTE — Discharge Instructions (Signed)
Upper Respiratory Infection, Adult For drainage going in the back of your throat and sore throat recommend taking either Allegra, Zyrtec or Claritin. For stronger medication you may take Chlor-Trimeton that this may call some drowsiness. Cepacol lozenges for sore throat Ibuprofen 600 mg every 6 hours as needed for sore throat and discomfort. Drink plenty of fluids and stay well-hydrated Most upper respiratory infections (URIs) are a viral infection of the air passages leading to the lungs. A URI affects the nose, throat, and upper air passages. The most common type of URI is nasopharyngitis and is typically referred to as "the common cold." URIs run their course and usually go away on their own. Most of the time, a URI does not require medical attention, but sometimes a bacterial infection in the upper airways can follow a viral infection. This is called a secondary infection. Sinus and middle ear infections are common types of secondary upper respiratory infections. Bacterial pneumonia can also complicate a URI. A URI can worsen asthma and chronic obstructive pulmonary disease (COPD). Sometimes, these complications can require emergency medical care and may be life threatening.  CAUSES Almost all URIs are caused by viruses. A virus is a type of germ and can spread from one person to another.  RISKS FACTORS You may be at risk for a URI if:   You smoke.   You have chronic heart or lung disease.  You have a weakened defense (immune) system.   You are very young or very old.   You have nasal allergies or asthma.  You work in crowded or poorly ventilated areas.  You work in health care facilities or schools. SIGNS AND SYMPTOMS  Symptoms typically develop 2-3 days after you come in contact with a cold virus. Most viral URIs last 7-10 days. However, viral URIs from the influenza virus (flu virus) can last 14-18 days and are typically more severe. Symptoms may include:   Runny or stuffy  (congested) nose.   Sneezing.   Cough.   Sore throat.   Headache.   Fatigue.   Fever.   Loss of appetite.   Pain in your forehead, behind your eyes, and over your cheekbones (sinus pain).  Muscle aches.  DIAGNOSIS  Your health care provider may diagnose a URI by:  Physical exam.  Tests to check that your symptoms are not due to another condition such as:  Strep throat.  Sinusitis.  Pneumonia.  Asthma. TREATMENT  A URI goes away on its own with time. It cannot be cured with medicines, but medicines may be prescribed or recommended to relieve symptoms. Medicines may help:  Reduce your fever.  Reduce your cough.  Relieve nasal congestion. HOME CARE INSTRUCTIONS   Take medicines only as directed by your health care provider.   Gargle warm saltwater or take cough drops to comfort your throat as directed by your health care provider.  Use a warm mist humidifier or inhale steam from a shower to increase air moisture. This may make it easier to breathe.  Drink enough fluid to keep your urine clear or pale yellow.   Eat soups and other clear broths and maintain good nutrition.   Rest as needed.   Return to work when your temperature has returned to normal or as your health care provider advises. You may need to stay home longer to avoid infecting others. You can also use a face mask and careful hand washing to prevent spread of the virus.  Increase the usage of your inhaler  if you have asthma.   Do not use any tobacco products, including cigarettes, chewing tobacco, or electronic cigarettes. If you need help quitting, ask your health care provider. PREVENTION  The best way to protect yourself from getting a cold is to practice good hygiene.   Avoid oral or hand contact with people with cold symptoms.   Wash your hands often if contact occurs.  There is no clear evidence that vitamin C, vitamin E, echinacea, or exercise reduces the chance of  developing a cold. However, it is always recommended to get plenty of rest, exercise, and practice good nutrition.  SEEK MEDICAL CARE IF:   You are getting worse rather than better.   Your symptoms are not controlled by medicine.   You have chills.  You have worsening shortness of breath.  You have brown or red mucus.  You have yellow or brown nasal discharge.  You have pain in your face, especially when you bend forward.  You have a fever.  You have swollen neck glands.  You have pain while swallowing.  You have white areas in the back of your throat. SEEK IMMEDIATE MEDICAL CARE IF:   You have severe or persistent:  Headache.  Ear pain.  Sinus pain.  Chest pain.  You have chronic lung disease and any of the following:  Wheezing.  Prolonged cough.  Coughing up blood.  A change in your usual mucus.  You have a stiff neck.  You have changes in your:  Vision.  Hearing.  Thinking.  Mood. MAKE SURE YOU:   Understand these instructions.  Will watch your condition.  Will get help right away if you are not doing well or get worse.   This information is not intended to replace advice given to you by your health care provider. Make sure you discuss any questions you have with your health care provider.   Document Released: 07/21/2000 Document Revised: 06/11/2014 Document Reviewed: 05/02/2013 Elsevier Interactive Patient Education [employer]

## 2016-02-03 ENCOUNTER — Ambulatory Visit (HOSPITAL_COMMUNITY)
Admission: EM | Admit: 2016-02-03 | Discharge: 2016-02-03 | Disposition: A | Discharge status: Home / Self Care | Payer: 59 | Attending: Family Medicine | Admitting: Family Medicine

## 2016-02-03 ENCOUNTER — Encounter (HOSPITAL_COMMUNITY): Payer: Self-pay | Admitting: Family Medicine

## 2016-02-03 DIAGNOSIS — J069 Acute upper respiratory infection, unspecified: Secondary | ICD-10-CM

## 2016-02-03 DIAGNOSIS — R059 Cough, unspecified: Secondary | ICD-10-CM

## 2016-02-03 DIAGNOSIS — R05 Cough: Secondary | ICD-10-CM

## 2016-02-03 MED ORDER — DEXTROMETHORPHAN HBR 15 MG/5ML PO SYRP: 10.0000 mL | ORAL_SOLUTION | Freq: Four times a day (QID) | ORAL | 0 refills | Status: DC | PRN

## 2016-02-03 MED ORDER — PREDNISONE 10 MG (21) PO TBPK: 10.0000 mg | ORAL_TABLET | Freq: Every day | ORAL | 0 refills | Status: DC

## 2016-02-03 NOTE — ED Provider Notes (Signed)
CSN: [id number]     Arrival date & time 02/03/16  [id number] History   First MD Initiated Contact with Patient 02/03/16 1037     Chief Complaint  Patient presents with  . URI   (Consider location/radiation/quality/duration/timing/severity/associated sxs/prior Treatment) Cough congestion since thurs. States that family has been sick around him with same sx. Denies any fever, no nausea or emesis intemit loose bowels. Has only take cold and sinus medication with minimal relief.        Past Medical History:  Diagnosis Date  . ADHD (attention deficit hyperactivity disorder)    denies taking meds  . Bipolar disorder (HCC)    Pt reports that this runs in his family but he has never been officially diagnosed. Pt reports that his mom used to give him Seroquel as a child but "made me feel like a zombie so I wouldn't take it".  . Headache(784.0)   . Pneumonia    Past Surgical History:  Procedure Laterality Date  . ANTERIOR CRUCIATE LIGAMENT REPAIR Right 07/03/2013   Procedure: RECONSTRUCTION ANTERIOR CRUCIATE LIGAMENT (ACL) WITH HAMSTRING GRAFT;  Surgeon: Cammy CopaGregory Scott Dean, MD;  [facility];  Service: Orthopedics;  Laterality: Right;  RIGHT KNEE ANTERIOR CRUCIATE LIGAMENT RECONSTRUCTION, POSTERIOR CRUCIATE LIGAMENT RECONSTRUCTION, MENISCAL  DEBRIDEMENT, HAMSTRING AUTOGRAFT.  Marland Kitchen. 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 Pt reports that heart lining of cow was then attached so that tissue would expand as pt grew. Dr. Trey SailorsMark Roy performed surgery.  . FRACTURE SURGERY  2010   nasal fracture  . KNEE SURGERY    . POSTERIOR CRUCIATE LIGAMENT RECONSTRUCTION Right 07/03/2013   Procedure: RECONSTRUCTION OF FIBULAR COLLATERAL LIGAMENT;  Surgeon: Cammy CopaGregory Scott Dean, MD;  [facility];  Service: Orthopedics;  Laterality: Right;   History reviewed. No pertinent family history. Social History  Substance Use Topics  . Smoking  status: Never Smoker  . Smokeless tobacco: Never Used  . Alcohol use No    Review of Systems  Constitutional: Negative.   HENT: Positive for congestion and postnasal drip.   Eyes: Negative.   Respiratory: Positive for cough.   Cardiovascular: Negative.   Gastrointestinal: Positive for diarrhea.  Skin: Negative.   Neurological: Negative.     Allergies  Patient has no known allergies.  Home Medications   Prior to Admission medications   Medication Sig Start Date End Date Taking? Authorizing Provider  dextromethorphan 15 MG/5ML syrup Take 10 mLs (30 mg total) by mouth 4 (four) times daily as needed for cough. 02/03/16   Tobi BastosMelanie A Dennie Moltz, NP  methocarbamol (ROBAXIN) 500 MG tablet Take 1 tablet (500 mg total) by mouth every 6 (six) hours as needed for muscle spasms. Patient not taking: Reported on 02/25/2015 07/04/13   Cammy CopaScott Gregory Dean, MD  oseltamivir (TAMIFLU) 75 MG capsule Take 1 capsule (75 mg total) by mouth every 12 (twelve) hours. Take all of medication. Patient not taking: Reported on 02/25/2015 02/12/14   Linna HoffJames D Kindl, MD  oxyCODONE (OXY IR/ROXICODONE) 5 MG immediate release tablet Take 1-2 tablets (5-10 mg total) by mouth every 3 (three) hours as needed for breakthrough pain. Patient not taking: Reported on 02/25/2015 07/04/13   Cammy CopaScott Gregory Dean, MD  predniSONE (STERAPRED UNI-PAK 21 TAB) 10 MG (21) TBPK tablet Take 1 tablet (10 mg total) by mouth daily. Take 6 tabs by mouth daily  for 2 days, then 5 tabs for 2 days, then  4 tabs for 2 days, then 3 tabs for 2 days, 2 tabs for 2 days, then 1 tab by mouth daily for 2 days 02/03/16   Tobi BastosMelanie A Shondell Fabel, NP  warfarin (COUMADIN) 5 MG tablet Take 1 tablet (5 mg total) by mouth daily. Patient not taking: Reported on 02/25/2015 07/04/13   Cammy CopaScott Gregory Dean, MD   Meds Ordered and Administered this Visit  Medications - No data to display  BP 125/80   Pulse (!) 54   Temp 98.1 F (36.7 C)   Resp 18   SpO2 100%  No data  found.   Physical Exam  Constitutional: He appears well-developed.  HENT:  Post nasal drip, erythema to oropharyngeal. No pustula's.   Eyes: Pupils are equal, round, and reactive to light.  Neck: Normal range of motion.  Cardiovascular: Normal rate and regular rhythm.   Pulmonary/Chest: Effort normal and breath sounds normal.  Abdominal: Soft. Bowel sounds are normal.  Musculoskeletal: Normal range of motion.  Neurological: He is alert.  Skin: Skin is warm.    Urgent Care Course   Clinical Course     Procedures (including critical care time)  Labs Review Labs Reviewed - No data to display  Imaging Review No results found.          MDM   1. Upper respiratory tract infection, unspecified type   2. Cough     Take meds as prescribed Cough may linger for up to 2 weeks  May use a humidifier as a need for the cough Take tylenol or motrin as needed      Tobi BastosMelanie A Robbin Loughmiller, NP 02/03/16 1045

## 2016-02-03 NOTE — ED Triage Notes (Signed)
Pt here for URI symptoms.  

## 2017-11-06 ENCOUNTER — Encounter (HOSPITAL_COMMUNITY): Payer: Self-pay | Admitting: *Deleted

## 2017-11-06 ENCOUNTER — Ambulatory Visit (HOSPITAL_COMMUNITY)
Admission: EM | Admit: 2017-11-06 | Discharge: 2017-11-06 | Disposition: A | Discharge status: Home / Self Care | Payer: 59 | Attending: Urgent Care | Admitting: Urgent Care

## 2017-11-06 DIAGNOSIS — F909 Attention-deficit hyperactivity disorder, unspecified type: Secondary | ICD-10-CM | POA: Insufficient documentation

## 2017-11-06 DIAGNOSIS — Z9889 Other specified postprocedural states: Secondary | ICD-10-CM | POA: Insufficient documentation

## 2017-11-06 DIAGNOSIS — N50811 Right testicular pain: Secondary | ICD-10-CM | POA: Insufficient documentation

## 2017-11-06 DIAGNOSIS — F319 Bipolar disorder, unspecified: Secondary | ICD-10-CM | POA: Insufficient documentation

## 2017-11-06 MED ORDER — MELOXICAM 7.5 MG PO TABS: 7.5000 mg | ORAL_TABLET | Freq: Every day | ORAL | 0 refills | Status: DC

## 2017-11-06 NOTE — ED Provider Notes (Signed)
  MRN: [id number] DOB: 1990/12/10  Subjective:   Gregory [name] is a 27 y.o. male presenting for 3-day history of intermittent sharp pain over right testicle.  Patient has been trying to examine the area and reports that he felt a pop with immediate relief but his pain has since returned.  Denies fever, genital rash, dysuria, hematuria, urinary frequency, history of undescended testicle.  Denies trauma.  Patient is sexually active with his wife only.  Has not tried any medications for relief.  Gurshan is not currently taking any medications.  No Known Allergies  Past Medical History:  Diagnosis Date  . ADHD (attention deficit hyperactivity disorder)    denies taking meds  . Bipolar disorder (HCC)    Pt reports that this runs in his family but he has never been officially diagnosed. Pt reports that his mom used to give him Seroquel as a child but "made me feel like a zombie so I wouldn't take it".  . Headache(784.0)   . Pneumonia      Past Surgical History:  Procedure Laterality Date  . ANTERIOR CRUCIATE LIGAMENT REPAIR Right 07/03/2013   Procedure: RECONSTRUCTION ANTERIOR CRUCIATE LIGAMENT (ACL) WITH HAMSTRING GRAFT;  Surgeon: [name], MD;  Location: [facility] OR;  Service: Orthopedics;  Laterality: Right;  RIGHT KNEE ANTERIOR CRUCIATE LIGAMENT RECONSTRUCTION, POSTERIOR CRUCIATE LIGAMENT RECONSTRUCTION, MENISCAL  DEBRIDEMENT, HAMSTRING AUTOGRAFT.  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 Pt reports that heart lining of cow was then attached so that tissue would expand as pt grew. Dr. Trey Sailors performed surgery.  . FRACTURE SURGERY  2010   nasal fracture  . KNEE SURGERY    . POSTERIOR CRUCIATE LIGAMENT RECONSTRUCTION Right 07/03/2013   Procedure: RECONSTRUCTION OF FIBULAR COLLATERAL LIGAMENT;  Surgeon: [name], MD;  Location: [facility] OR;  Service: Orthopedics;  Laterality: Right;    Objective:    Vitals: BP 117/80   Pulse 75   Temp 98 F (36.7 C) (Oral)   Resp 18   SpO2 98%   Physical Exam  Constitutional: He is oriented to person, place, and time. He appears well-developed and well-nourished.  Cardiovascular: Normal rate.  Pulmonary/Chest: Effort normal.  Genitourinary: Circumcised.     Neurological: He is alert and oriented to person, place, and time.   Assessment and Plan :   Testicular pain, right  We will manage supportively for now.  Discussed the possibility of a varicocele versus infectious source including UTI, STI.  Patient will try to set up an office visit with a primary care practice to obtain a testicular ultrasound.  In the meantime, we will manage with meloxicam.  Labs pending for UTI, STI testing.  ER return to clinic precautions reviewed.   Wallis Bamberg, PA-C 11/06/17 1201

## 2017-11-06 NOTE — ED Triage Notes (Signed)
Pt reports pinching scrotum 3 days ago; initially had "instant relief".  Since then, c/o intermittent pain to area, with spouse stating the area is purple.

## 2017-11-06 NOTE — Discharge Instructions (Addendum)
Primary Care at [facility] [address] [place], [place], [place] [address]

## 2017-11-07 ENCOUNTER — Ambulatory Visit: Payer: Self-pay | Admitting: Family Medicine

## 2017-11-07 ENCOUNTER — Encounter: Payer: Self-pay | Admitting: Family Medicine

## 2017-11-07 VITALS — BP 110/80 | HR 79 | Temp 98.2°F | Ht 69.0 in | Wt 187.6 lb

## 2017-11-07 DIAGNOSIS — N50811 Right testicular pain: Secondary | ICD-10-CM | POA: Insufficient documentation

## 2017-11-07 LAB — URINE CYTOLOGY ANCILLARY ONLY
Chlamydia: NEGATIVE
Neisseria Gonorrhea: NEGATIVE
Trichomonas: NEGATIVE

## 2017-11-07 LAB — URINE CULTURE: Culture: NO GROWTH

## 2017-11-07 NOTE — Assessment & Plan Note (Signed)
-  Urine cytology pending -Scrotal US with doppler ordered for further evaluation -Discussed red flag symptoms and reasons to seek emergency care.

## 2017-11-07 NOTE — Progress Notes (Signed)
Gregory [name] - 27 y.o. male MRN [id number]  Date of birth: Feb 01, 1991  Subjective Chief Complaint  Patient presents with  . Testicle Pain    right testicle     HPI Gregory [name] is a 27 y.o. male here today to establish care with new pcp and has concern of testicular pain.  Pain has been intermittent and started a few weeks ago.  Denies injury.  Felt like there was a nodule or vein that "popped" while he was palpating the testicle a few days ago and pain went away but returned shortly afterwards.  Denies pain currently. Seen at urgent care yesterday for same issue urine culture was collected and negative.  Urine cytology for STD testing is still pending.  He denies fever, chills, flank pain, dysuria or penial discharge.   ROS:  A comprehensive ROS was completed and negative except as noted per HPI  No Known Allergies  Past Medical History:  Diagnosis Date  . ADHD (attention deficit hyperactivity disorder)    denies taking meds  . Bipolar disorder (HCC)    Pt reports that this runs in his family but he has never been officially diagnosed. Pt reports that his mom used to give him Seroquel as a child but "made me feel like a zombie so I wouldn't take it".  . Headache(784.0)   . Pneumonia     Past Surgical History:  Procedure Laterality Date  . ANTERIOR CRUCIATE LIGAMENT REPAIR Right 07/03/2013   Procedure: RECONSTRUCTION ANTERIOR CRUCIATE LIGAMENT (ACL) WITH HAMSTRING GRAFT;  Surgeon: [name], MD;  Location[facility] OR;  Service: Orthopedics;  Laterality: Right;  RIGHT KNEE ANTERIOR CRUCIATE LIGAMENT RECONSTRUCTION, POSTERIOR CRUCIATE LIGAMENT RECONSTRUCTION, MENISCAL  DEBRIDEMENT, HAMSTRING AUTOGRAFT.  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 Pt reports that heart lining of cow was then attached so that tissue would expand as pt grew. Dr. Trey Sailors performed surgery.  . FRACTURE SURGERY  2010   nasal fracture  . KNEE SURGERY    . POSTERIOR CRUCIATE LIGAMENT RECONSTRUCTION Right 07/03/2013   Procedure: RECONSTRUCTION OF FIBULAR COLLATERAL LIGAMENT;  Surgeon: [name], MD;  Location: [facility] OR;  Service: Orthopedics;  Laterality: Right;    Social History   Socioeconomic History  . Marital status: Single    Spouse name: Not on file  . Number of children: Not on file  . Years of education: Not on file  . Highest education level: Not on file  Occupational History  . Not on file  Social Needs  . Financial resource strain: Not on file  . Food insecurity:    Worry: Not on file    Inability: Not on file  . Transportation needs:    Medical: Not on file    Non-medical: Not on file  Tobacco Use  . Smoking status: Never Smoker  . Smokeless tobacco: Never Used  Substance and Sexual Activity  . Alcohol use: No  . Drug use: No  . Sexual activity: Not on file  Lifestyle  . Physical activity:    Days per week: Not on file    Minutes per session: Not on file  . Stress: Not on file  Relationships  . Social connections:    Talks on phone: Not on file    Gets together: Not on file    Attends religious service: Not on file    Active member of club  or organization: Not on file    Attends meetings of clubs or organizations: Not on file    Relationship status: Not on file  Other Topics Concern  . Not on file  Social History Narrative  . Not on file    Family History  Problem Relation Age of Onset  . Diabetes Mother   . Chiari malformation Mother     Health Maintenance  Topic Date Due  . Janet Berlin  04/13/2010  . HIV Screening  11/08/2018 (Originally 04/13/2006)  . INFLUENZA VACCINE  11/16/2018 (Originally 09/08/2017)    ----------------------------------------------------------------------------------------------------------------------------------------------------------------------------------------------------------------- Physical Exam BP 110/80   Pulse 79    Temp 98.2 F (36.8 C)   Ht 5\' 9"  (1.753 m)   Wt 187 lb 9.6 oz (85.1 kg)   SpO2 97%   BMI 27.70 kg/m   Physical Exam  Constitutional: He is oriented to person, place, and time. He appears well-nourished. No distress.  HENT:  Head: Normocephalic and atraumatic.  Cardiovascular: Normal rate and regular rhythm.  Pulmonary/Chest: Effort normal and breath sounds normal.  Genitourinary: Penis normal. No penile tenderness.  Genitourinary Comments: No penile discharge Mild ttp to posterior testicle with mild fullness on R side of scrotum compared to L.  No nodules palpated.   Neurological: He is alert and oriented to person, place, and time.  Skin: Skin is warm and dry.  Psychiatric: He has a normal mood and affect. His behavior is normal.    ------------------------------------------------------------------------------------------------------------------------------------------------------------------------------------------------------------------- Assessment and Plan  Pain in right testicle -Urine cytology pending -Scrotal US with doppler ordered for further evaluation -Discussed red flag symptoms and reasons to seek emergency care.

## 2017-11-07 NOTE — Patient Instructions (Signed)
You will receive a call to arrange for testicular ultrasound If the pain worsens significantly before you have the ultrasound you should seek emergency care.

## 2017-11-08 ENCOUNTER — Ambulatory Visit (HOSPITAL_BASED_OUTPATIENT_CLINIC_OR_DEPARTMENT_OTHER)
Admission: RE | Admit: 2017-11-08 | Discharge: 2017-11-08 | Disposition: A | Discharge status: Home / Self Care | Payer: Self-pay | Source: Ambulatory Visit | Attending: Family Medicine | Admitting: Family Medicine

## 2017-11-08 DIAGNOSIS — R9389 Abnormal findings on diagnostic imaging of other specified body structures: Secondary | ICD-10-CM | POA: Insufficient documentation

## 2017-11-08 DIAGNOSIS — N50811 Right testicular pain: Secondary | ICD-10-CM

## 2017-11-09 NOTE — Progress Notes (Signed)
Please let him know what ultrasound looks ok.  No sign of tumors or decreased blood flow to testicles.  Recommend supportive underwear to help with pain control and support.  He may continue current medication.

## 2018-02-21 ENCOUNTER — Other Ambulatory Visit: Payer: Self-pay

## 2018-02-21 ENCOUNTER — Encounter (HOSPITAL_COMMUNITY): Payer: Self-pay

## 2018-02-21 ENCOUNTER — Ambulatory Visit (HOSPITAL_COMMUNITY)
Admission: EM | Admit: 2018-02-21 | Discharge: 2018-02-21 | Disposition: A | Discharge status: Home / Self Care | Payer: BLUE CROSS/BLUE SHIELD | Attending: Family Medicine | Admitting: Family Medicine

## 2018-02-21 DIAGNOSIS — M62830 Muscle spasm of back: Secondary | ICD-10-CM | POA: Insufficient documentation

## 2018-02-21 MED ORDER — CYCLOBENZAPRINE HCL 10 MG PO TABS: ORAL_TABLET | ORAL | 0 refills | Status: DC

## 2018-02-21 NOTE — ED Triage Notes (Signed)
Pt cc right shoulder pain x 2 weeks and pt states she heard his shoulder popped 2 nights ago.

## 2018-02-21 NOTE — ED Provider Notes (Signed)
[facility]   [id number] 02/21/18 Arrival Time: 0857  ASSESSMENT & PLAN:  1. Muscle spasm of back    Meds ordered this encounter  Medications  . cyclobenzaprine (FLEXERIL) 10 MG tablet    Sig: Take 1 tablet by mouth 3 times daily as needed for muscle spasm. Warning: May cause drowsiness.    Dispense:  21 tablet    Refill:  0   Activities as he tolerates.  Follow-up Information    [facility].   Specialty:  Urgent Care Why:  As needed. Contact information: [address] [place] [id number] [phone]        Will f/u if not improving over the next week. Rest the injured area as much as practical. Medication sedation precautions given.  Reviewed expectations re: course of current medical issues. Questions answered. Outlined signs and symptoms indicating need for more acute intervention. Patient verbalized understanding. After Visit Summary given.  SUBJECTIVE: History from: patient. Gregory [name] is a 28 y.o. male who reports intermittent moderate pain of his right upper back over trapezius distribution; described as soreness with occasional sharp pains; without radiation. Onset: gradual, noticed 2 nights ago "after feeling a pop over the area". Injury/trama: no. Symptoms have stabilized since beginning. Aggravating factors: movement. Alleviating factors: rest. Associated symptoms: none reported. Extremity sensation changes or weakness: none. Self treatment: ibuprofen; helps. History of similar: no. Reports no SOB or CP.  Past Surgical History:  Procedure Laterality Date  . ANTERIOR CRUCIATE LIGAMENT REPAIR Right 07/03/2013   Procedure: RECONSTRUCTION ANTERIOR CRUCIATE LIGAMENT (ACL) WITH HAMSTRING GRAFT;  Surgeon: Cammy CopaGregory Scott Dean, MD;  [facility];  Service: Orthopedics;  Laterality: Right;  RIGHT KNEE ANTERIOR CRUCIATE LIGAMENT RECONSTRUCTION, POSTERIOR CRUCIATE LIGAMENT RECONSTRUCTION,  MENISCAL  DEBRIDEMENT, HAMSTRING AUTOGRAFT.  Gregory [name]. 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 Pt reports that heart lining of cow was then attached so that tissue would expand as pt grew. Dr. Trey SailorsMark Roy performed surgery.  . FRACTURE SURGERY  2010   nasal fracture  . KNEE SURGERY    . POSTERIOR CRUCIATE LIGAMENT RECONSTRUCTION Right 07/03/2013   Procedure: RECONSTRUCTION OF FIBULAR COLLATERAL LIGAMENT;  Surgeon: Cammy CopaGregory Scott Dean, MD;  [facility];  Service: Orthopedics;  Laterality: Right;     ROS: As per HPI. All other systems negative    OBJECTIVE:  Vitals:   02/21/18 1012 02/21/18 1014  BP:  119/73  Pulse:  86  Resp:  18  Temp:  98.2 F (36.8 C)  TempSrc:  Oral  SpO2:  100%  Weight: 83 kg     General appearance: alert; no distress Neck: supple with FROM; no midline tenderness Back: point tenderness over mid trapezius muscle; does feel tight when compared with opposite side CV: RRR Lungs: unlabored respirations; CTAB Extremities: . RUE: warm and well perfused; without tenderness over R shoulder; without gross deformities; with no swelling; with no bruising; ROM: normal Skin: warm and dry; no visible rashes Neurologic: gait normal; normal reflexes of RUE and LUE; normal sensation of RUE and LUE; normal strength of RUE and LUE Psychological: alert and cooperative; normal mood and affect  No Known Allergies  Past Medical History:  Diagnosis Date  . ADHD (attention deficit hyperactivity disorder)    denies taking meds  . Bipolar disorder (HCC)    Pt reports that this runs in his family but he  has never been officially diagnosed. Pt reports that his mom used to give him Seroquel as a child but "made me feel like a zombie so I wouldn't take it".  . Headache(784.0)   . Pneumonia    Social History   Socioeconomic History  . Marital status: Single    Spouse name: Not on file  . Number of  children: Not on file  . Years of education: Not on file  . Highest education level: Not on file  Occupational History  . Not on file  Social Needs  . Financial resource strain: Not on file  . Food insecurity:    Worry: Not on file    Inability: Not on file  . Transportation needs:    Medical: Not on file    Non-medical: Not on file  Tobacco Use  . Smoking status: Never Smoker  . Smokeless tobacco: Never Used  Substance and Sexual Activity  . Alcohol use: No  . Drug use: No  . Sexual activity: Not on file  Lifestyle  . Physical activity:    Days per week: Not on file    Minutes per session: Not on file  . Stress: Not on file  Relationships  . Social connections:    Talks on phone: Not on file    Gets together: Not on file    Attends religious service: Not on file    Active member of club or organization: Not on file    Attends meetings of clubs or organizations: Not on file    Relationship status: Not on file  Other Topics Concern  . Not on file  Social History Narrative  . Not on file   Family History  Problem Relation Age of Onset  . Diabetes Mother   . Chiari malformation Mother    Past Surgical History:  Procedure Laterality Date  . ANTERIOR CRUCIATE LIGAMENT REPAIR Right 07/03/2013   Procedure: RECONSTRUCTION ANTERIOR CRUCIATE LIGAMENT (ACL) WITH HAMSTRING GRAFT;  Surgeon: Cammy CopaGregory Scott Dean, MD;  [facility];  Service: Orthopedics;  Laterality: Right;  RIGHT KNEE ANTERIOR CRUCIATE LIGAMENT RECONSTRUCTION, POSTERIOR CRUCIATE LIGAMENT RECONSTRUCTION, MENISCAL  DEBRIDEMENT, HAMSTRING AUTOGRAFT.  Gregory [name]. 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 Pt reports that heart lining of cow was then attached so that tissue would expand as pt grew. Dr. Trey SailorsMark Roy performed surgery.  . FRACTURE SURGERY  2010   nasal fracture  . KNEE SURGERY    . POSTERIOR CRUCIATE LIGAMENT RECONSTRUCTION Right 07/03/2013    Procedure: RECONSTRUCTION OF FIBULAR COLLATERAL LIGAMENT;  Surgeon: Cammy CopaGregory Scott Dean, MD;  [facility];  Service: Orthopedics;  Laterality: Right;      Mardella LaymanHagler, Rolly Magri, MD 02/21/18 1158

## 2018-11-06 ENCOUNTER — Encounter (HOSPITAL_BASED_OUTPATIENT_CLINIC_OR_DEPARTMENT_OTHER): Payer: Self-pay | Admitting: *Deleted

## 2018-11-06 ENCOUNTER — Other Ambulatory Visit: Payer: Self-pay

## 2018-11-06 ENCOUNTER — Emergency Department (HOSPITAL_BASED_OUTPATIENT_CLINIC_OR_DEPARTMENT_OTHER): Payer: Self-pay

## 2018-11-06 ENCOUNTER — Inpatient Hospital Stay (HOSPITAL_BASED_OUTPATIENT_CLINIC_OR_DEPARTMENT_OTHER)
Admission: EM | Admit: 2018-11-06 | Discharge: 2018-11-10 | DRG: 872 | Disposition: A | Discharge status: Home / Self Care | Payer: Self-pay | Attending: Internal Medicine | Admitting: Internal Medicine

## 2018-11-06 ENCOUNTER — Inpatient Hospital Stay (HOSPITAL_COMMUNITY): Payer: Self-pay

## 2018-11-06 DIAGNOSIS — R112 Nausea with vomiting, unspecified: Secondary | ICD-10-CM | POA: Diagnosis present

## 2018-11-06 DIAGNOSIS — R197 Diarrhea, unspecified: Secondary | ICD-10-CM | POA: Diagnosis present

## 2018-11-06 DIAGNOSIS — R509 Fever, unspecified: Secondary | ICD-10-CM

## 2018-11-06 DIAGNOSIS — F319 Bipolar disorder, unspecified: Secondary | ICD-10-CM | POA: Diagnosis present

## 2018-11-06 DIAGNOSIS — F909 Attention-deficit hyperactivity disorder, unspecified type: Secondary | ICD-10-CM | POA: Diagnosis present

## 2018-11-06 DIAGNOSIS — R7401 Elevation of levels of liver transaminase levels: Secondary | ICD-10-CM | POA: Diagnosis present

## 2018-11-06 DIAGNOSIS — D72829 Elevated white blood cell count, unspecified: Secondary | ICD-10-CM | POA: Diagnosis present

## 2018-11-06 DIAGNOSIS — Z79899 Other long term (current) drug therapy: Secondary | ICD-10-CM

## 2018-11-06 DIAGNOSIS — Z791 Long term (current) use of non-steroidal anti-inflammatories (NSAID): Secondary | ICD-10-CM

## 2018-11-06 DIAGNOSIS — R161 Splenomegaly, not elsewhere classified: Secondary | ICD-10-CM

## 2018-11-06 DIAGNOSIS — K810 Acute cholecystitis: Secondary | ICD-10-CM | POA: Diagnosis present

## 2018-11-06 DIAGNOSIS — R162 Hepatomegaly with splenomegaly, not elsewhere classified: Secondary | ICD-10-CM | POA: Diagnosis present

## 2018-11-06 DIAGNOSIS — K7689 Other specified diseases of liver: Secondary | ICD-10-CM | POA: Diagnosis present

## 2018-11-06 DIAGNOSIS — Z833 Family history of diabetes mellitus: Secondary | ICD-10-CM

## 2018-11-06 DIAGNOSIS — R17 Unspecified jaundice: Secondary | ICD-10-CM

## 2018-11-06 DIAGNOSIS — R6510 Systemic inflammatory response syndrome (SIRS) of non-infectious origin without acute organ dysfunction: Secondary | ICD-10-CM | POA: Insufficient documentation

## 2018-11-06 DIAGNOSIS — E876 Hypokalemia: Secondary | ICD-10-CM | POA: Diagnosis present

## 2018-11-06 DIAGNOSIS — Z20828 Contact with and (suspected) exposure to other viral communicable diseases: Secondary | ICD-10-CM | POA: Diagnosis present

## 2018-11-06 DIAGNOSIS — R21 Rash and other nonspecific skin eruption: Secondary | ICD-10-CM | POA: Diagnosis present

## 2018-11-06 DIAGNOSIS — N390 Urinary tract infection, site not specified: Secondary | ICD-10-CM | POA: Diagnosis present

## 2018-11-06 DIAGNOSIS — R1110 Vomiting, unspecified: Secondary | ICD-10-CM

## 2018-11-06 DIAGNOSIS — R1011 Right upper quadrant pain: Secondary | ICD-10-CM

## 2018-11-06 DIAGNOSIS — A419 Sepsis, unspecified organism: Principal | ICD-10-CM | POA: Diagnosis present

## 2018-11-06 DIAGNOSIS — R945 Abnormal results of liver function studies: Secondary | ICD-10-CM | POA: Diagnosis present

## 2018-11-06 DIAGNOSIS — R791 Abnormal coagulation profile: Secondary | ICD-10-CM | POA: Diagnosis present

## 2018-11-06 DIAGNOSIS — E860 Dehydration: Secondary | ICD-10-CM | POA: Diagnosis present

## 2018-11-06 DIAGNOSIS — J069 Acute upper respiratory infection, unspecified: Secondary | ICD-10-CM | POA: Diagnosis present

## 2018-11-06 LAB — URINALYSIS, ROUTINE W REFLEX MICROSCOPIC
Glucose, UA: NEGATIVE mg/dL
Ketones, ur: 20 mg/dL — AB
Leukocytes,Ua: NEGATIVE
Nitrite: NEGATIVE
Protein, ur: 30 mg/dL — AB
Specific Gravity, Urine: 1.026 (ref 1.005–1.030)
pH: 6 (ref 5.0–8.0)

## 2018-11-06 LAB — CBC WITH DIFFERENTIAL/PLATELET
Abs Immature Granulocytes: 0.41 10*3/uL — ABNORMAL HIGH (ref 0.00–0.07)
Basophils Absolute: 0.1 10*3/uL (ref 0.0–0.1)
Basophils Relative: 0 %
Eosinophils Absolute: 0.2 10*3/uL (ref 0.0–0.5)
Eosinophils Relative: 1 %
HCT: 49 % (ref 39.0–52.0)
Hemoglobin: 16.2 g/dL (ref 13.0–17.0)
Immature Granulocytes: 2 %
Lymphocytes Relative: 7 %
Lymphs Abs: 1.4 10*3/uL (ref 0.7–4.0)
MCH: 26.7 pg (ref 26.0–34.0)
MCHC: 33.1 g/dL (ref 30.0–36.0)
MCV: 80.9 fL (ref 80.0–100.0)
Monocytes Absolute: 1.1 10*3/uL — ABNORMAL HIGH (ref 0.1–1.0)
Monocytes Relative: 5 %
Neutro Abs: 17.1 10*3/uL — ABNORMAL HIGH (ref 1.7–7.7)
Neutrophils Relative %: 85 %
Platelets: 268 10*3/uL (ref 150–400)
RBC: 6.06 MIL/uL — ABNORMAL HIGH (ref 4.22–5.81)
RDW: 12.3 % (ref 11.5–15.5)
WBC: 20.2 10*3/uL — ABNORMAL HIGH (ref 4.0–10.5)
nRBC: 0 % (ref 0.0–0.2)

## 2018-11-06 LAB — COMPREHENSIVE METABOLIC PANEL
ALT: 140 U/L — ABNORMAL HIGH (ref 0–44)
AST: 97 U/L — ABNORMAL HIGH (ref 15–41)
Albumin: 3 g/dL — ABNORMAL LOW (ref 3.5–5.0)
Alkaline Phosphatase: 430 U/L — ABNORMAL HIGH (ref 38–126)
Anion gap: 17 — ABNORMAL HIGH (ref 5–15)
BUN: 16 mg/dL (ref 6–20)
CO2: 26 mmol/L (ref 22–32)
Calcium: 8.9 mg/dL (ref 8.9–10.3)
Chloride: 88 mmol/L — ABNORMAL LOW (ref 98–111)
Creatinine, Ser: 0.86 mg/dL (ref 0.61–1.24)
GFR calc Af Amer: >60 mL/min (ref >60)
GFR calc non Af Amer: >60 mL/min (ref >60)
Glucose, Bld: 160 mg/dL — ABNORMAL HIGH (ref 70–99)
Potassium: 3.1 mmol/L — ABNORMAL LOW (ref 3.5–5.1)
Sodium: 131 mmol/L — ABNORMAL LOW (ref 135–145)
Total Bilirubin: 7.7 mg/dL — ABNORMAL HIGH (ref 0.3–1.2)
Total Protein: 7.1 g/dL (ref 6.5–8.1)

## 2018-11-06 LAB — LIPASE, BLOOD: Lipase: 19 U/L (ref 11–51)

## 2018-11-06 LAB — CK TOTAL AND CKMB (NOT AT ARMC)
CK, MB: 1.2 ng/mL (ref 0.5–5.0)
Relative Index: INVALID (ref 0.0–2.5)
Total CK: 25 U/L — ABNORMAL LOW (ref 49–397)

## 2018-11-06 LAB — TSH: TSH: 0.335 u[IU]/mL — ABNORMAL LOW (ref 0.350–4.500)

## 2018-11-06 LAB — D-DIMER, QUANTITATIVE: D-Dimer, Quant: 2.34 ug/mL-FEU — ABNORMAL HIGH (ref 0.00–0.50)

## 2018-11-06 LAB — SARS CORONAVIRUS 2 BY RT PCR (HOSPITAL ORDER, PERFORMED IN ~~LOC~~ HOSPITAL LAB): SARS Coronavirus 2: NEGATIVE

## 2018-11-06 MED ORDER — SODIUM CHLORIDE (PF) 0.9 % IJ SOLN
INTRAMUSCULAR | Status: AC
  Filled 2018-11-06: qty 50

## 2018-11-06 MED ORDER — ENOXAPARIN SODIUM 40 MG/0.4ML ~~LOC~~ SOLN
40.0000 mg | SUBCUTANEOUS | Status: DC
  Administered 2018-11-07 – 2018-11-09 (×4): 40 mg via SUBCUTANEOUS
  Filled 2018-11-06 (×4): qty 0.4

## 2018-11-06 MED ORDER — POTASSIUM CHLORIDE IN NACL 20-0.9 MEQ/L-% IV SOLN
INTRAVENOUS | Status: AC
  Administered 2018-11-06: 21:00:00 via INTRAVENOUS
  Filled 2018-11-06: qty 1000

## 2018-11-06 MED ORDER — ACETAMINOPHEN 650 MG RE SUPP: 650.0000 mg | Freq: Four times a day (QID) | RECTAL | Status: DC | PRN

## 2018-11-06 MED ORDER — SODIUM CHLORIDE 0.9 % IV BOLUS
1000.0000 mL | Freq: Once | INTRAVENOUS | Status: AC
  Administered 2018-11-06: 1000 mL via INTRAVENOUS

## 2018-11-06 MED ORDER — ONDANSETRON HCL 4 MG/2ML IJ SOLN
4.0000 mg | Freq: Once | INTRAMUSCULAR | Status: AC
  Administered 2018-11-06: 09:00:00 4 mg via INTRAVENOUS
  Filled 2018-11-06: qty 2

## 2018-11-06 MED ORDER — PIPERACILLIN-TAZOBACTAM 3.375 G IVPB 30 MIN
3.3750 g | Freq: Once | INTRAVENOUS | Status: AC
  Administered 2018-11-06: 3.375 g via INTRAVENOUS
  Filled 2018-11-06 (×2): qty 50

## 2018-11-06 MED ORDER — IOHEXOL 350 MG/ML SOLN
100.0000 mL | Freq: Once | INTRAVENOUS | Status: AC | PRN
  Administered 2018-11-06: 100 mL via INTRAVENOUS

## 2018-11-06 MED ORDER — VANCOMYCIN HCL 10 G IV SOLR
1500.0000 mg | Freq: Once | INTRAVENOUS | Status: AC
  Administered 2018-11-06: 1500 mg via INTRAVENOUS
  Filled 2018-11-06: qty 1500

## 2018-11-06 MED ORDER — VANCOMYCIN HCL 10 G IV SOLR
1500.0000 mg | Freq: Two times a day (BID) | INTRAVENOUS | Status: DC
  Administered 2018-11-07: 1500 mg via INTRAVENOUS
  Filled 2018-11-06: qty 1500

## 2018-11-06 MED ORDER — LACTATED RINGERS IV SOLN
INTRAVENOUS | Status: DC
  Administered 2018-11-06: 15:00:00 via INTRAVENOUS

## 2018-11-06 MED ORDER — PIPERACILLIN-TAZOBACTAM 3.375 G IVPB
3.3750 g | Freq: Three times a day (TID) | INTRAVENOUS | Status: DC
  Administered 2018-11-07 – 2018-11-08 (×5): 3.375 g via INTRAVENOUS
  Filled 2018-11-06 (×4): qty 50

## 2018-11-06 MED ORDER — ACETAMINOPHEN 325 MG PO TABS: 650.0000 mg | ORAL_TABLET | Freq: Four times a day (QID) | ORAL | Status: DC | PRN

## 2018-11-06 MED ORDER — SODIUM CHLORIDE 0.9 % IV SOLN
INTRAVENOUS | Status: DC | PRN
  Administered 2018-11-06: 250 mL via INTRAVENOUS

## 2018-11-06 NOTE — ED Notes (Signed)
US at bedside

## 2018-11-06 NOTE — ED Notes (Signed)
ED Provider at bedside. 

## 2018-11-06 NOTE — ED Notes (Signed)
Called [facility] - advised that patient had a bed ready @ [facility] ([phone])

## 2018-11-06 NOTE — Progress Notes (Signed)
Pt's wife show pinkish red rashes on pt's back that has started forming towards the arm down to buttock in the last several days. Pt states that the rash does not itch and was unaware that they were there until wife pointed it out to him. He also stated that he's been feeling hot and has been sweating more frequently while sleeping which he thinks might have caused those rashes to occur. Dr Maudie Mercury has been informed.

## 2018-11-06 NOTE — ED Triage Notes (Signed)
Pt reports n/v/d x Friday, was tested for covid and was neg on Thursday, told he had a sinus infection and took some of his family members leftover zithromax. Pt reports intermittent fevers, body aches, and chills.

## 2018-11-06 NOTE — Progress Notes (Signed)
Pharmacy Antibiotic Note  Gregory [name] is a 28 y.o. male admitted on 11/06/2018 with possible cholangitis vs acalculous cholecystitis. Pharmacy has been consulted for vancomycin and Zosyn dosing.  Plan:  Vancomycin 1500 mg IV q12 hr (est AUC 493 based on SCr rounded to 1.0 - CrCl using actual SCr of 0.86 was > 140 ml/min)  Measure vancomycin AUC at steady state as indicated  Zosyn 3.375 g IV given once over 30 minutes, then every 8 hrs by 4-hr infusion  SCr daily while on vanc + Zosyn  Height: 6' (182.9 cm) Weight: 175 lb 9.6 oz (79.7 kg) IBW/kg (Calculated) : 77.6  Temp (24hrs), Avg:99.5 F (37.5 C), Min:98.6 F (37 C), Max:100.3 F (37.9 C)  Recent Labs  Lab 11/06/18 0825  WBC 20.2*  CREATININE 0.86    Estimated Creatinine Clearance: 141.6 mL/min (by C-G formula based on SCr of 0.86 mg/dL).    No Known Allergies  Antimicrobials this admission: 9/28 vanc >>  9/28 Zosyn >>   Dose adjustments this admission: n/a  Microbiology results: 9/28: COVID2 neg  Thank you for allowing pharmacy to be a part of this patient's care.  Reuel Boom, PharmD, BCPS [phone] 11/06/2018, 8:14 PM

## 2018-11-06 NOTE — ED Notes (Signed)
Provided pt with mouth swabs d/t c/o dry mouth. Education provided re: NPO status

## 2018-11-06 NOTE — Plan of Care (Signed)
[facility] Plan of Care Note  Patient: Gregory [name]    NMM:[id number]  PCP: Patient, No Pcp Per    DOB: [birth year]  DOS: 11/06/2018   Received a phone call from [place] regarding transfer of Mr. Gregory [name]. Requesting: Dr. Dr. Laverta Baltimore Reason for transfer: Per request of general surgery History: Patient presents with complaints of nausea vomiting and diarrhea.  Was given antibiotic by family outpatient but continues to have fever body ache and chills.  Repeat COVID test was negative.  But ultrasound of the abdomen was suggestive of acalculous cholecystitis. Patient was initially discussed with general surgery, Dr. Redmond Pulling who felt that the patient based on the age should not have a calculus cholecystitis and without any gallstone evidence on the ultrasound is not a surgical candidate for now. The recommended medical management with IV antibiotics and further work-up. General surgery will consult on the patient and determine whether the patient is a surgical candidate or not. Treatment received: IV fluid, IV antibiotics  Plan of care: The patient will be accepted for admission to [facility], telemetry unit.  Author: Berle Mull, MD [facility] 11/06/2018  If 7PM-7AM, please contact night-coverage at [email or web address],

## 2018-11-06 NOTE — H&P (Signed)
TRH H&P    Patient Demographics:    Gregory [name], is a 28 y.o. male  MRN: [id number]  DOB - 09/07/1990  Admit Date - 11/06/2018  Referring MD/NP/PA:  Nanda Quinton  Outpatient Primary MD for the patient is Patient, No Pcp Per  Patient coming from:  home  Chief complaint-  Fever,    HPI:    Gregory [name]  is a 28 y.o. male, w hx of chiari malformation, ADD, Bipolar do, c/o fever, chills,cough, n/v, diarrhea since Thursday per patient.  Pt tested negative for covid-19 on Thursday.  Pt took zpak and was not feeling better   In ED,  T 98.6, P 120, R 18, Bp 134/106  Pox 100%  Wt 79.2 kg  TM 100.3,    RUQ ultrasound IMPRESSION: 1. The gallbladder wall appears thickened and subtly edematous. No gallstones evident. Question a degree of acalculus cholecystitis. This finding may warrant nuclear medicine hepatobiliary imaging study to assess for cystic duct patency.  2. Liver enlargement. The overall liver echogenicity is increased. There is an area of decreased echogenicity near the gallbladder fossa, probably representing localized fatty sparing. Beyond apparent fatty sparing, no focal liver lesions evident. It should be noted that the sensitivity of ultrasound for detection of focal liver lesions is somewhat diminished in this circumstance.  3.  Splenomegaly.  No focal splenic lesions evident.  CXR Impression:  No active disease   Na 131, K 3.1, Bun 16, Creatinine 0.86 Ast 97, Alt 140, Alk phos 430, T. Bili 7.7  Wbc 20.2, Hgb 16.2, Plt 268  Urinalysis prot  30 Wbc 11-20  Pt will be admitted for sepsis (fever, tachycardia, leukocytosis), acute lower uti, abnormal liver function, ? Cholecystitis, acute lower uti.  And hypokalemia,     Review of systems:    In addition to the HPI above,    No Headache, No changes with Vision or hearing, No problems swallowing food or Liquids, No Chest  pain, No Shortness of Breath Slight abdominal discomfort No Blood in stool or Urine, No dysuria, No new skin rashes or bruises, No new joints pains-aches,  No new weakness, tingling, numbness in any extremity, No recent weight gain or loss, No polyuria, polydypsia or polyphagia, No significant Mental Stressors.  All other systems reviewed and are negative.    Past History of the following :    Past Medical History:  Diagnosis Date  . ADHD (attention deficit hyperactivity disorder)    denies taking meds  . Bipolar disorder ([place])    Pt reports that this runs in his family but he has never been officially diagnosed. Pt reports that his mom used to give him Seroquel as a child but "made me feel like a zombie so I wouldn't take it".  . Headache(784.0)   . Pneumonia       Past Surgical History:  Procedure Laterality Date  . ANTERIOR CRUCIATE LIGAMENT REPAIR Right 07/03/2013   Procedure: RECONSTRUCTION ANTERIOR CRUCIATE LIGAMENT (ACL) WITH HAMSTRING GRAFT;  Surgeon: Meredith Pel, MD;  Location: [place];  Service: Orthopedics;  Laterality: Right;  RIGHT KNEE ANTERIOR CRUCIATE LIGAMENT RECONSTRUCTION, POSTERIOR CRUCIATE LIGAMENT RECONSTRUCTION, MENISCAL  DEBRIDEMENT, HAMSTRING AUTOGRAFT.  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 Pt reports that heart lining of cow was then attached so that tissue would expand as pt grew. Dr. Glenna Fellows performed surgery.  . FRACTURE SURGERY  2010   nasal fracture  . KNEE SURGERY    . POSTERIOR CRUCIATE LIGAMENT RECONSTRUCTION Right 07/03/2013   Procedure: RECONSTRUCTION OF FIBULAR COLLATERAL LIGAMENT;  Surgeon: Meredith Pel, MD;  Location: [place];  Service: Orthopedics;  Laterality: Right;      Social History:      Social History   Tobacco Use  . Smoking status: Never Smoker  . Smokeless tobacco: Never Used  Substance Use Topics  . Alcohol use: No        Family History :     Family History  Problem Relation Age of Onset  . Diabetes Mother   . Chiari malformation Mother        Home Medications:   Prior to Admission medications   Medication Sig Start Date End Date Taking? Authorizing Provider  cyclobenzaprine (FLEXERIL) 10 MG tablet Take 1 tablet by mouth 3 times daily as needed for muscle spasm. Warning: May cause drowsiness. Patient not taking: Reported on 11/06/2018 02/21/18   Vanessa Kick, MD  meloxicam (MOBIC) 7.5 MG tablet Take 1 tablet (7.5 mg total) by mouth daily. Patient not taking: Reported on 11/06/2018 11/06/17   Jaynee Eagles, PA-C     Allergies:    No Known Allergies   Physical Exam:   Vitals  Blood pressure 120/77, pulse (!) 102, temperature 97.6 F (36.4 C), resp. rate 18, height 6' (1.829 m), weight 79.7 kg, SpO2 97 %.  1.  General: axoxo3  2. Psychiatric: euthymic  3. Neurologic: nonfocal  4. HEENMT:  Anicteric, pupils 1.46m symmetric, direct, consensual intact  5. Respiratory : CTAB  6. Cardiovascular : rrr s1s2, no m/g/r  7. Gastrointestinal:  Abd: soft, nt, nd, negative murphy sign, +bs  8. Skin:  Ext: no c/c/e,  Slight rash on the back, maculopapular  9.Musculoskeletal:  Good ROM    Data Review:    CBC Recent Labs  Lab 11/06/18 0825  WBC 20.2*  HGB 16.2  HCT 49.0  PLT 268  MCV 80.9  MCH 26.7  MCHC 33.1  RDW 12.3  LYMPHSABS 1.4  MONOABS 1.1*  EOSABS 0.2  BASOSABS 0.1   ------------------------------------------------------------------------------------------------------------------  Results for orders placed or performed during the hospital encounter of 11/06/18 (from the past 48 hour(s))  Comprehensive metabolic panel     Status: Abnormal   Collection Time: 11/06/18  8:25 AM  Result Value Ref Range   Sodium 131 (L) 135 - 145 mmol/L   Potassium 3.1 (L) 3.5 - 5.1 mmol/L   Chloride 88 (L) 98 - 111 mmol/L   CO2 26 22 - 32 mmol/L   Glucose, Bld 160 (H) 70 - 99 mg/dL    BUN 16 6 - 20 mg/dL   Creatinine, Ser 0.86 0.61 - 1.24 mg/dL   Calcium 8.9 8.9 - 10.3 mg/dL   Total Protein 7.1 6.5 - 8.1 g/dL   Albumin 3.0 (L) 3.5 - 5.0 g/dL   AST 97 (H) 15 - 41 U/L   ALT 140 (H) 0 - 44 U/L   Alkaline Phosphatase 430 (H) 38 - 126 U/L  Total Bilirubin 7.7 (H) 0.3 - 1.2 mg/dL   GFR calc non Af Amer >60 >60 mL/min   GFR calc Af Amer >60 >60 mL/min   Anion gap 17 (H) 5 - 15    Comment: Performed at [facility], [address] [place], [place] [address]  Lipase, blood     Status: None   Collection Time: 11/06/18  8:25 AM  Result Value Ref Range   Lipase 19 11 - 51 U/L    Comment: Performed at [facility], [address] [place], [place] [address]  CBC with Differential     Status: Abnormal   Collection Time: 11/06/18  8:25 AM  Result Value Ref Range   WBC 20.2 (H) 4.0 - 10.5 K/uL   RBC 6.06 (H) 4.22 - 5.81 MIL/uL   Hemoglobin 16.2 13.0 - 17.0 g/dL   HCT 49.0 39.0 - 52.0 %   MCV 80.9 80.0 - 100.0 fL   MCH 26.7 26.0 - 34.0 pg   MCHC 33.1 30.0 - 36.0 g/dL   RDW 12.3 11.5 - 15.5 %   Platelets 268 150 - 400 K/uL   nRBC 0.0 0.0 - 0.2 %   Neutrophils Relative % 85 %   Neutro Abs 17.1 (H) 1.7 - 7.7 K/uL   Lymphocytes Relative 7 %   Lymphs Abs 1.4 0.7 - 4.0 K/uL   Monocytes Relative 5 %   Monocytes Absolute 1.1 (H) 0.1 - 1.0 K/uL   Eosinophils Relative 1 %   Eosinophils Absolute 0.2 0.0 - 0.5 K/uL   Basophils Relative 0 %   Basophils Absolute 0.1 0.0 - 0.1 K/uL   Immature Granulocytes 2 %   Abs Immature Granulocytes 0.41 (H) 0.00 - 0.07 K/uL    Comment: Performed at [facility], [address] [place], [place] [address]  SARS Coronavirus 2 [facility] order, Performed in [facility] hospital lab) Nasopharyngeal Nasopharyngeal Swab     Status: None   Collection Time: 11/06/18 11:18 AM   Specimen: Nasopharyngeal Swab  Result Value Ref Range   SARS Coronavirus 2 NEGATIVE NEGATIVE    Comment: (NOTE) If result is NEGATIVE  SARS-CoV-2 target nucleic acids are NOT DETECTED. The SARS-CoV-2 RNA is generally detectable in upper and lower  respiratory specimens during the acute phase of infection. The lowest  concentration of SARS-CoV-2 viral copies this assay can detect is 250  copies / mL. A negative result does not preclude SARS-CoV-2 infection  and should not be used as the sole basis for treatment or other  patient management decisions.  A negative result may occur with  improper specimen collection / handling, submission of specimen other  than nasopharyngeal swab, presence of viral mutation(s) within the  areas targeted by this assay, and inadequate number of viral copies  (<250 copies / mL). A negative result must be combined with clinical  observations, patient history, and epidemiological information. If result is POSITIVE SARS-CoV-2 target nucleic acids are DETECTED. The SARS-CoV-2 RNA is generally detectable in upper and lower  respiratory specimens dur ing the acute phase of infection.  Positive  results are indicative of active infection with SARS-CoV-2.  Clinical  correlation with patient history and other diagnostic information is  necessary to determine patient infection status.  Positive results do  not rule out bacterial infection or co-infection with other viruses. If result is PRESUMPTIVE POSTIVE SARS-CoV-2 nucleic acids MAY BE PRESENT.   A presumptive positive result was obtained on the  submitted specimen  and confirmed on repeat testing.  While 2019 novel coronavirus  (SARS-CoV-2) nucleic acids may be present in the submitted sample  additional confirmatory testing may be necessary for epidemiological  and / or clinical management purposes  to differentiate between  SARS-CoV-2 and other Sarbecovirus currently known to infect humans.  If clinically indicated additional testing with an alternate test  methodology [phone]) is advised. The SARS-CoV-2 RNA is generally  detectable in upper  and lower respiratory sp ecimens during the acute  phase of infection. The expected result is Negative. Fact Sheet for Patients:  StrictlyIdeas.no Fact Sheet for Healthcare Providers: BankingDealers.co.za This test is not yet approved or cleared by the [place] FDA and has been authorized for detection and/or diagnosis of SARS-CoV-2 by FDA under an Emergency Use Authorization (EUA).  This EUA will remain in effect (meaning this test can be used) for the duration of the COVID-19 declaration under Section 564(b)(1) of the Act, 21 U.S.C. section 360bbb-3(b)(1), unless the authorization is terminated or revoked sooner. Performed at [facility], [address] [place], [place] [address]   Urinalysis, Routine w reflex microscopic     Status: Abnormal   Collection Time: 11/06/18  7:05 PM  Result Value Ref Range   Color, Urine AMBER (A) YELLOW    Comment: BIOCHEMICALS MAY BE AFFECTED BY COLOR   APPearance CLEAR CLEAR   Specific Gravity, Urine 1.026 1.005 - 1.030   pH 6.0 5.0 - 8.0   Glucose, UA NEGATIVE NEGATIVE mg/dL   Hgb urine dipstick SMALL (A) NEGATIVE   Bilirubin Urine MODERATE (A) NEGATIVE   Ketones, ur 20 (A) NEGATIVE mg/dL   Protein, ur 30 (A) NEGATIVE mg/dL   Nitrite NEGATIVE NEGATIVE   Leukocytes,Ua NEGATIVE NEGATIVE   RBC / HPF 0-5 0 - 5 RBC/hpf   WBC, UA 11-20 0 - 5 WBC/hpf   Bacteria, UA RARE (A) NONE SEEN   Mucus PRESENT     Comment: Performed at [facility], [place] [address] [place], [place] [address]  TSH     Status: Abnormal   Collection Time: 11/06/18  7:24 PM  Result Value Ref Range   TSH 0.335 (L) 0.350 - 4.500 uIU/mL    Comment: Performed by a 3rd Generation assay with a functional sensitivity of <=0.01 uIU/mL. Performed at [facility], [place] [address], [place], [place] [address]   D-dimer, quantitative (not at [facility])     Status: Abnormal   Collection Time:  11/06/18  7:24 PM  Result Value Ref Range   D-Dimer, Quant 2.34 (H) 0.00 - 0.50 ug/mL-FEU    Comment: (NOTE) At the manufacturer cut-off of 0.50 ug/mL FEU, this assay has been documented to exclude PE with a sensitivity and negative predictive value of 97 to 99%.  At this time, this assay has not been approved by the FDA to exclude DVT/VTE. Results should be correlated with clinical presentation. Performed at [facility], [place] [address], [place], [place] [address]   CK total and CKMB (cardiac)not at [facility]     Status: Abnormal   Collection Time: 11/06/18  7:24 PM  Result Value Ref Range   Total CK 25 (L) 49 - 397 U/L   CK, MB 1.2 0.5 - 5.0 ng/mL   Relative Index RELATIVE INDEX IS INVALID 0.0 - 2.5    Comment: WHEN CK < 100 U/L        Performed at [facility]  [address] [place], [place] [address]     Chemistries  Recent Labs  Lab 11/06/18 0825  NA 131*  K 3.1*  CL 88*  CO2 26  GLUCOSE 160*  BUN 16  CREATININE 0.86  CALCIUM 8.9  AST 97*  ALT 140*  ALKPHOS 430*  BILITOT 7.7*   ------------------------------------------------------------------------------------------------------------------  ------------------------------------------------------------------------------------------------------------------ GFR: Estimated Creatinine Clearance: 141.6 mL/min (by C-G formula based on SCr of 0.86 mg/dL). Liver Function Tests: Recent Labs  Lab 11/06/18 0825  AST 97*  ALT 140*  ALKPHOS 430*  BILITOT 7.7*  PROT 7.1  ALBUMIN 3.0*   Recent Labs  Lab 11/06/18 0825  LIPASE 19   No results for input(s): AMMONIA in the last 168 hours. Coagulation Profile: No results for input(s): INR, PROTIME in the last 168 hours. Cardiac Enzymes: Recent Labs  Lab 11/06/18 1924  CKTOTAL 25*  CKMB 1.2   BNP (last 3 results) No results for input(s): PROBNP in the last 8760 hours. HbA1C: No results for input(s): HGBA1C in the last 72 hours. CBG: No  results for input(s): GLUCAP in the last 168 hours. Lipid Profile: No results for input(s): CHOL, HDL, LDLCALC, TRIG, CHOLHDL, LDLDIRECT in the last 72 hours. Thyroid Function Tests: Recent Labs    11/06/18 1924  TSH 0.335*   Anemia Panel: No results for input(s): VITAMINB12, FOLATE, FERRITIN, TIBC, IRON, RETICCTPCT in the last 72 hours.  --------------------------------------------------------------------------------------------------------------- Urine analysis:    Component Value Date/Time   COLORURINE AMBER (A) 11/06/2018 1905   APPEARANCEUR CLEAR 11/06/2018 1905   LABSPEC 1.026 11/06/2018 1905   PHURINE 6.0 11/06/2018 1905   GLUCOSEU NEGATIVE 11/06/2018 1905   HGBUR SMALL (A) 11/06/2018 1905   BILIRUBINUR MODERATE (A) 11/06/2018 1905   KETONESUR 20 (A) 11/06/2018 1905   PROTEINUR 30 (A) 11/06/2018 1905   NITRITE NEGATIVE 11/06/2018 1905   LEUKOCYTESUR NEGATIVE 11/06/2018 1905      Imaging Results:    Dg Chest Portable 1 View  Result Date: 11/06/2018 CLINICAL DATA:  Cough and fever EXAM: PORTABLE CHEST 1 VIEW COMPARISON:  None. FINDINGS: The heart size and mediastinal contours are within normal limits. Both lungs are clear. The visualized skeletal structures are unremarkable. IMPRESSION: No active disease. Electronically Signed   By: Inez Catalina M.D.   On: 11/06/2018 08:50   US Abdomen Limited Ruq  Result Date: 11/06/2018 CLINICAL DATA:  Upper abdominal pain with nausea and vomiting. Fever. EXAM: ULTRASOUND ABDOMEN LIMITED RIGHT UPPER QUADRANT COMPARISON:  None. FINDINGS: Gallbladder: No gallstones are evident. The gallbladder wall appears thickened and subtly edematous. No pericholecystic fluid evident. No sonographic Murphy sign noted by sonographer. Common bile duct: Diameter: 4 mm. No intrahepatic or extrahepatic biliary duct dilatation. Liver: No focal lesion identified. Liver echogenicity is overall increased. There is an area of suspected fatty sparing near the  gallbladder fossa. Liver measures approximately 20 cm in length. Portal vein is patent on color Doppler imaging with normal direction of blood flow towards the liver. Other: Spleen measures 17.2 x 8.7 x 16.9 cm with a measured splenic volume of 1,325 cubic cm. No focal splenic lesions evident. IMPRESSION: 1. The gallbladder wall appears thickened and subtly edematous. No gallstones evident. Question a degree of acalculus cholecystitis. This finding may warrant nuclear medicine hepatobiliary imaging study to assess for cystic duct patency. 2. Liver enlargement. The overall liver echogenicity is increased. There is an area of decreased echogenicity near the gallbladder fossa, probably representing localized fatty sparing. Beyond apparent fatty sparing, no focal liver lesions evident. It should be  noted that the sensitivity of ultrasound for detection of focal liver lesions is somewhat diminished in this circumstance. 3.  Splenomegaly.  No focal splenic lesions evident. Electronically Signed   By: Lowella Grip III M.D.   On: 11/06/2018 10:49       Assessment & Plan:    Principal Problem:   Sepsis ([place]) Active Problems:   Abnormal liver function   Leukocytosis  Sepsis (fever, tachycardia, leukocytosis) ddx acute lower uit, acalculous cholecystitis Blood culture x2 Check esr vanco iv, zosyn iv pharmacy to dose  Tachycardia Tele D dimer , if positive then CTA chest r/o PE Check cardiac echo  Abnormal liver function ? Acalculous cholecystitis Check acute hepatitis panel  Check Hida scan NPO Surgery consulted by Ed, appreciate input  Hypokalemia Replete Check cmp in am  TSH elevation,  ? Hyperthyroidism Check free t4, free t3  DVT Prophylaxis-   Lovenox - SCDs    AM Labs Ordered, also please review Full Orders  Family Communication: Admission, patients condition and plan of care including tests being ordered have been discussed with the patient  who indicate understanding and  agree with the plan and Code Status.  Code Status:  FULL CODE per patient,  Pt accompanied by significant other  Admission status: Inpatient: Based on patients clinical presentation and evaluation of above clinical data, I have made determination that patient meets Inpatient criteria at this time. Pt has sepsis and will require iv abx , pt has high risk of clinical deterioration, pt will be admitted for sepsis.   Time spent in minutes : 70   Jani Gravel M.D on 11/06/2018 at 9:53 PM

## 2018-11-06 NOTE — ED Provider Notes (Signed)
Emergency Department Provider Note   I have reviewed the triage vital signs and the nursing notes.   HISTORY  Chief Complaint Emesis   HPI Gregory [name] is a 28 y.o. male with past medical history reviewed below presents to the emergency department with congestion, cough, vomiting, diarrhea.  Cough and congestion symptoms began approximately 7 days ago.  Patient took some leftover azithromycin from a family member with no improvement in symptoms.  He has since developed nausea, vomiting, diarrhea which is nonbloody.  He denies any active chest pain or abdominal pain.  He has some mild dyspnea.  He did go to urgent care in the middle of last week.  He states he had a rapid COVID-19 test at that time which was negative.  He has been taking the azithromycin along with over-the-counter medications but states he is been unable to eat or drink because of nausea vomiting and feels very dehydrated which prompted his ED visit today.   Past Medical History:  Diagnosis Date   ADHD (attention deficit hyperactivity disorder)    denies taking meds   Bipolar disorder ([place])    Pt reports that this runs in his family but he has never been officially diagnosed. Pt reports that his mom used to give him Seroquel as a child but "made me feel like a zombie so I wouldn't take it".   [id number](256.3)    Pneumonia     Patient Active Problem List   Diagnosis Date Noted   SIRS (systemic inflammatory response syndrome) ([place]) 11/06/2018   Pain in right testicle 11/07/2017   ACL tear 07/03/2013    Past Surgical History:  Procedure Laterality Date   ANTERIOR CRUCIATE LIGAMENT REPAIR Right 07/03/2013   Procedure: RECONSTRUCTION ANTERIOR CRUCIATE LIGAMENT (ACL) WITH HAMSTRING GRAFT;  Surgeon: Meredith Pel, MD;  Location: [facility];  Service: Orthopedics;  Laterality: Right;  RIGHT KNEE ANTERIOR CRUCIATE LIGAMENT RECONSTRUCTION, POSTERIOR CRUCIATE LIGAMENT RECONSTRUCTION, MENISCAL  DEBRIDEMENT,  HAMSTRING AUTOGRAFT.   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 Pt reports that heart lining of cow was then attached so that tissue would expand as pt grew. Dr. Glenna Fellows performed surgery.   FRACTURE SURGERY  2010   nasal fracture   KNEE SURGERY     POSTERIOR CRUCIATE LIGAMENT RECONSTRUCTION Right 07/03/2013   Procedure: RECONSTRUCTION OF FIBULAR COLLATERAL LIGAMENT;  Surgeon: Meredith Pel, MD;  Location: [facility];  Service: Orthopedics;  Laterality: Right;    Allergies Patient has no known allergies.  Family History  Problem Relation Age of Onset   Diabetes Mother    Chiari malformation Mother     Social History Social History   Tobacco Use   Smoking status: Never Smoker   Smokeless tobacco: Never Used  Substance Use Topics   Alcohol use: No   Drug use: No    Review of Systems  Constitutional: Positive fever/chills and fatigue.  Eyes: No visual changes. ENT: No sore throat. Positive nasal discharge.  Cardiovascular: Denies chest pain. Respiratory: Denies shortness of breath. Gastrointestinal: No abdominal pain.  Positive nausea and vomiting.  No diarrhea.  No constipation. Genitourinary: Negative for dysuria. Musculoskeletal: Negative for back pain. Skin: Negative for rash. Neurological: Negative for focal weakness or numbness. Positive HA.   10-point ROS otherwise negative.  ____________________________________________   PHYSICAL EXAM:  VITAL SIGNS: ED Triage Vitals  Enc Vitals Group     BP  11/06/18 0821 (!) 134/106     Pulse Rate 11/06/18 0821 (!) 120     Resp 11/06/18 0821 18     Temp 11/06/18 0821 98.6 F (37 C)     Temp Source 11/06/18 0821 Oral     SpO2 11/06/18 0821 100 %     Weight 11/06/18 0816 174 lb 9.7 oz (79.2 kg)     Height 11/06/18 0816 6' (1.829 m)   Constitutional: Alert and oriented. Well appearing and in no acute distress. Eyes: Conjunctivae  are normal.  Head: Atraumatic. Nose: No congestion/rhinnorhea. Mouth/Throat: Mucous membranes are dry.  Neck: No stridor. Cardiovascular: Tachycardia. Good peripheral circulation. Grossly normal heart sounds.   Respiratory: Normal respiratory effort.  No retractions. Lungs CTAB. Gastrointestinal: Soft with mild epigastric tenderness. No focal RUQ tenderness or Murphy's sign. No distention.  Musculoskeletal: No lower extremity tenderness nor edema. No gross deformities of extremities. Neurologic:  Normal speech and language.  Skin:  Skin is warm, dry and intact. No rash noted.   ____________________________________________   LABS (all labs ordered are listed, but only abnormal results are displayed)  Labs Reviewed  COMPREHENSIVE METABOLIC PANEL - Abnormal; Notable for the following components:      Result Value   Sodium 131 (*)    Potassium 3.1 (*)    Chloride 88 (*)    Glucose, Bld 160 (*)    Albumin 3.0 (*)    AST 97 (*)    ALT 140 (*)    Alkaline Phosphatase 430 (*)    Total Bilirubin 7.7 (*)    Anion gap 17 (*)    All other components within normal limits  CBC WITH DIFFERENTIAL/PLATELET - Abnormal; Notable for the following components:   WBC 20.2 (*)    RBC 6.06 (*)    Neutro Abs 17.1 (*)    Monocytes Absolute 1.1 (*)    Abs Immature Granulocytes 0.41 (*)    All other components within normal limits  SARS CORONAVIRUS 2 (HOSPITAL ORDER, [facility])  LIPASE, BLOOD   ____________________________________________  RADIOLOGY  Dg Chest Portable 1 View  Result Date: 11/06/2018 CLINICAL DATA:  Cough and fever EXAM: PORTABLE CHEST 1 VIEW COMPARISON:  None. FINDINGS: The heart size and mediastinal contours are within normal limits. Both lungs are clear. The visualized skeletal structures are unremarkable. IMPRESSION: No active disease. Electronically Signed   By: Inez Catalina M.D.   On: 11/06/2018 08:50   US Abdomen Limited Ruq  Result Date:  11/06/2018 CLINICAL DATA:  Upper abdominal pain with nausea and vomiting. Fever. EXAM: ULTRASOUND ABDOMEN LIMITED RIGHT UPPER QUADRANT COMPARISON:  None. FINDINGS: Gallbladder: No gallstones are evident. The gallbladder wall appears thickened and subtly edematous. No pericholecystic fluid evident. No sonographic Murphy sign noted by sonographer. Common bile duct: Diameter: 4 mm. No intrahepatic or extrahepatic biliary duct dilatation. Liver: No focal lesion identified. Liver echogenicity is overall increased. There is an area of suspected fatty sparing near the gallbladder fossa. Liver measures approximately 20 cm in length. Portal vein is patent on color Doppler imaging with normal direction of blood flow towards the liver. Other: Spleen measures 17.2 x 8.7 x 16.9 cm with a measured splenic volume of 1,325 cubic cm. No focal splenic lesions evident. IMPRESSION: 1. The gallbladder wall appears thickened and subtly edematous. No gallstones evident. Question a degree of acalculus cholecystitis. This finding may warrant nuclear medicine hepatobiliary imaging study to assess for cystic duct patency. 2. Liver enlargement. The overall liver echogenicity is  increased. There is an area of decreased echogenicity near the gallbladder fossa, probably representing localized fatty sparing. Beyond apparent fatty sparing, no focal liver lesions evident. It should be noted that the sensitivity of ultrasound for detection of focal liver lesions is somewhat diminished in this circumstance. 3.  Splenomegaly.  No focal splenic lesions evident. Electronically Signed   By: Lowella Grip III M.D.   On: 11/06/2018 10:49    ____________________________________________   PROCEDURES  Procedure(s) performed:   Procedures  CRITICAL CARE Performed by: Margette Fast Total critical care time: 35 minutes Critical care time was exclusive of separately billable procedures and treating other patients. Critical care was necessary to  treat or prevent imminent or life-threatening deterioration. Critical care was time spent personally by me on the following activities: development of treatment plan with patient and/or surrogate as well as nursing, discussions with consultants, evaluation of patient's response to treatment, examination of patient, obtaining history from patient or surrogate, ordering and performing treatments and interventions, ordering and review of laboratory studies, ordering and review of radiographic studies, pulse oximetry and re-evaluation of patient's condition.  Nanda Quinton, MD Emergency Medicine  ____________________________________________   INITIAL IMPRESSION / ASSESSMENT AND PLAN / ED COURSE  Pertinent labs & imaging results that were available during my care of the patient were reviewed by me and considered in my medical decision making (see chart for details).   Patient presents to the emergency department with congestion, cough, nausea, vomiting, diarrhea symptoms. No fever here but some tachycardia noted.  No hypotension.  Doubt sepsis.  Suspect viral type infection.  Tested negative for COVID-19 4 days ago.  Patient is not hypoxemic or having increased work of breathing.  His abdomen is diffusely soft and nontender.  No indication for abdominal imaging.  Will obtain chest x-ray to evaluate for possible underlying pneumonia along with screening lab work, IV fluid, Zofran.   09:00 AM  Patient with significant lab abnormalities including elevated bilirubin, alk phos, LFTs along with significant leukocytosis.  Lipase is normal.  No focal abdominal tenderness but given lab findings, fever my suspicion for cholangitis is elevated.  Will follow right upper quadrant ultrasound.   11:30 AM  Spoke with Dr. Redmond Pulling. Plans to consult. Would recommend HIDA and abx for now. Will consult [facility].   Discussed patient's case with [facility], Dr. Posey Pronto to request admission. Patient and family (if present) updated with  plan. Care transferred to [facility] service.  I reviewed all nursing notes, vitals, pertinent old records, EKGs, labs, imaging (as available).  ____________________________________________  FINAL CLINICAL IMPRESSION(S) / ED DIAGNOSES  Final diagnoses:  Non-intractable vomiting with nausea, unspecified vomiting type  Dehydration  RUQ abdominal pain    MEDICATIONS GIVEN DURING THIS VISIT:  Medications  0.9 %  sodium chloride infusion ( Intravenous Stopped 11/06/18 1205)  sodium chloride 0.9 % bolus 1,000 mL ( Intravenous Stopped 11/06/18 0946)  ondansetron (ZOFRAN) injection 4 mg (4 mg Intravenous Given 11/06/18 0837)  piperacillin-tazobactam (ZOSYN) IVPB 3.375 g ( Intravenous Stopped 11/06/18 1147)    Note:  This document was prepared using Dragon voice recognition software and may include unintentional dictation errors.  Nanda Quinton, MD, [facility] Emergency Medicine    Launa Goedken, [facility], MD 11/06/18 [phone]

## 2018-11-07 ENCOUNTER — Encounter (HOSPITAL_COMMUNITY): Payer: Self-pay | Admitting: General Surgery

## 2018-11-07 ENCOUNTER — Inpatient Hospital Stay (HOSPITAL_COMMUNITY): Payer: Self-pay

## 2018-11-07 DIAGNOSIS — E860 Dehydration: Secondary | ICD-10-CM

## 2018-11-07 DIAGNOSIS — R945 Abnormal results of liver function studies: Secondary | ICD-10-CM

## 2018-11-07 DIAGNOSIS — D72829 Elevated white blood cell count, unspecified: Secondary | ICD-10-CM

## 2018-11-07 DIAGNOSIS — R509 Fever, unspecified: Secondary | ICD-10-CM

## 2018-11-07 DIAGNOSIS — R112 Nausea with vomiting, unspecified: Secondary | ICD-10-CM

## 2018-11-07 DIAGNOSIS — R161 Splenomegaly, not elsewhere classified: Secondary | ICD-10-CM

## 2018-11-07 DIAGNOSIS — E876 Hypokalemia: Secondary | ICD-10-CM

## 2018-11-07 DIAGNOSIS — R1110 Vomiting, unspecified: Secondary | ICD-10-CM

## 2018-11-07 LAB — COMPREHENSIVE METABOLIC PANEL
ALT: 143 U/L — ABNORMAL HIGH (ref 0–44)
AST: 142 U/L — ABNORMAL HIGH (ref 15–41)
Albumin: 2.4 g/dL — ABNORMAL LOW (ref 3.5–5.0)
Alkaline Phosphatase: 439 U/L — ABNORMAL HIGH (ref 38–126)
Anion gap: 14 (ref 5–15)
BUN: 15 mg/dL (ref 6–20)
CO2: 27 mmol/L (ref 22–32)
Calcium: 8.2 mg/dL — ABNORMAL LOW (ref 8.9–10.3)
Chloride: 93 mmol/L — ABNORMAL LOW (ref 98–111)
Creatinine, Ser: 0.96 mg/dL (ref 0.61–1.24)
GFR calc Af Amer: >60 mL/min (ref >60)
GFR calc non Af Amer: >60 mL/min (ref >60)
Glucose, Bld: 109 mg/dL — ABNORMAL HIGH (ref 70–99)
Potassium: 3.2 mmol/L — ABNORMAL LOW (ref 3.5–5.1)
Sodium: 134 mmol/L — ABNORMAL LOW (ref 135–145)
Total Bilirubin: 8.4 mg/dL — ABNORMAL HIGH (ref 0.3–1.2)
Total Protein: 5.7 g/dL — ABNORMAL LOW (ref 6.5–8.1)

## 2018-11-07 LAB — ECHOCARDIOGRAM COMPLETE
Height: 72 in
Weight: 2793.67 oz

## 2018-11-07 LAB — CBC
HCT: 42.4 % (ref 39.0–52.0)
Hemoglobin: 13.8 g/dL (ref 13.0–17.0)
MCH: 27.5 pg (ref 26.0–34.0)
MCHC: 32.5 g/dL (ref 30.0–36.0)
MCV: 84.5 fL (ref 80.0–100.0)
Platelets: 245 10*3/uL (ref 150–400)
RBC: 5.02 MIL/uL (ref 4.22–5.81)
RDW: 13 % (ref 11.5–15.5)
WBC: 13.5 10*3/uL — ABNORMAL HIGH (ref 4.0–10.5)
nRBC: 0 % (ref 0.0–0.2)

## 2018-11-07 LAB — APTT: aPTT: 32 seconds (ref 24–36)

## 2018-11-07 LAB — HEPATITIS PANEL, ACUTE
HCV Ab: <0.1 s/co ratio (ref 0.0–0.9)
Hep A IgM: NEGATIVE
Hep B C IgM: NEGATIVE
Hepatitis B Surface Ag: NEGATIVE

## 2018-11-07 LAB — PROTIME-INR
INR: 1.9 — ABNORMAL HIGH (ref 0.8–1.2)
Prothrombin Time: 21.5 seconds — ABNORMAL HIGH (ref 11.4–15.2)

## 2018-11-07 LAB — SEDIMENTATION RATE: Sed Rate: 51 mm/hr — ABNORMAL HIGH (ref 0–16)

## 2018-11-07 LAB — HIV ANTIBODY (ROUTINE TESTING W REFLEX): HIV Screen 4th Generation wRfx: NONREACTIVE — AB

## 2018-11-07 MED ORDER — MORPHINE SULFATE (PF) 4 MG/ML IV SOLN
3.0000 mg | Freq: Once | INTRAVENOUS | Status: DC
  Filled 2018-11-07: qty 1

## 2018-11-07 MED ORDER — POTASSIUM CHLORIDE 10 MEQ/100ML IV SOLN
10.0000 meq | INTRAVENOUS | Status: AC
  Administered 2018-11-07 (×6): 10 meq via INTRAVENOUS
  Filled 2018-11-07 (×6): qty 100

## 2018-11-07 MED ORDER — TECHNETIUM TC 99M MEBROFENIN IV KIT
7.5000 | PACK | Freq: Once | INTRAVENOUS | Status: AC | PRN
  Administered 2018-11-07: 7.5 via INTRAVENOUS

## 2018-11-07 NOTE — Progress Notes (Signed)
Echocardiogram 2D Echocardiogram has been performed.  Gregory [name] 11/07/2018, 2:26 PM

## 2018-11-07 NOTE — Progress Notes (Signed)
PROGRESS NOTE    Gregory [name]  QJJ:[id number] DOB: 1991/01/12 DOA: 11/06/2018 PCP: Patient, No Pcp Per    Brief Narrative:  28 y.o. male, w hx of chiari malformation, ADD, Bipolar do, c/o fever, chills,cough, n/v, diarrhea since Thursday per patient.  Pt tested negative for covid-19 on Thursday.  Pt took zpak and was not feeling better   Assessment & Plan:   Principal Problem:   Sepsis (HCC) Active Problems:   Abnormal liver function   Leukocytosis   Acute lower UTI   Hypokalemia  Sepsis (fever, tachycardia, leukocytosis) ddx acute lower uit, acalculous cholecystitis -Initial concern for possible acalculous cholecystitis -General Surgery consulted initially, felt findings are not related to biliary disease, and recommended GI consultation -Appreciate GI input. Findings possible related to infection. HIV, COVID, hepatitis panel negative -EBV and CMV pending -Will repeat CMP in AM  Tachycardia -2d echo reviewed, normal LVEF -Ddimer elevated at 2.3, however CTA neg for PE -HR stable at present. Suspect related to presenting septic picture  Abnormal liver function ? Acalculous cholecystitis -Acute hepatitis panel negative -HIDA scan performed today, pending results -Surgery consulted by Ed, per above -GI now following -Repeat CMP in AM  Hypokalemia -Will replace -Follow lytes in AM  Low TSH -TSH of 0.335 at presentation -Will check Free T4 in AM  DVT prophylaxis: Lovenox subQ Code Status: Full Family Communication: Pt in room, family at bedside Disposition Plan: Uncertain at this time  Consultants:   GI  General Surgery  Procedures:   HIDA 9/29, results pending  Antimicrobials: Anti-infectives (From admission, onward)   Start     Dose/Rate Route Frequency Ordered Stop   11/07/18 0600  vancomycin (VANCOCIN) 1,500 mg in sodium chloride 0.9 % 500 mL IVPB  Status:  Discontinued     1,500 mg 250 mL/hr over 120 Minutes Intravenous Every 12 hours  11/06/18 2009 11/07/18 0824   11/06/18 2000  piperacillin-tazobactam (ZOSYN) IVPB 3.375 g     3.375 g 12.5 mL/hr over 240 Minutes Intravenous Every 8 hours 11/06/18 1919     11/06/18 1930  vancomycin (VANCOCIN) 1,500 mg in sodium chloride 0.9 % 500 mL IVPB     1,500 mg 250 mL/hr over 120 Minutes Intravenous  Once 11/06/18 1919 11/06/18 2300   11/06/18 1100  piperacillin-tazobactam (ZOSYN) IVPB 3.375 g     3.375 g 100 mL/hr over 30 Minutes Intravenous  Once 11/06/18 1057 11/06/18 1147       Subjective: Denies abd pain. Tolerating Jello  Objective: Vitals:   11/07/18 0500 11/07/18 0630 11/07/18 1250 11/07/18 1424  BP:  114/77 104/77   Pulse:  (!) 104 (!) 106   Resp:  14 16   Temp:  98.3 F (36.8 C) 98.6 F (37 C)   TempSrc:  Oral Oral   SpO2:  93% 96% 96%  Weight: 79.2 kg     Height:        Intake/Output Summary (Last 24 hours) at 11/07/2018 1653 Last data filed at 11/07/2018 1500 Gross per 24 hour  Intake 1718.41 ml  Output --  Net 1718.41 ml   Filed Weights   11/06/18 0816 11/06/18 1756 11/07/18 0500  Weight: 79.2 kg 79.7 kg 79.2 kg    Examination:  General exam: Appears calm and comfortable  Respiratory system: Clear to auscultation. Respiratory effort normal. Cardiovascular system: S1 & S2 heard, RRR Gastrointestinal system: Abdomen is nondistended, soft and nontender. No organomegaly or masses felt. Normal bowel sounds heard. Central nervous system: Alert and oriented. No  focal neurological deficits. Extremities: Symmetric 5 x 5 power. Skin: No rashes, lesions  Psychiatry: Judgement and insight appear normal. Mood & affect appropriate.   Data Reviewed: I have personally reviewed following labs and imaging studies  CBC: Recent Labs  Lab 11/06/18 0825 11/07/18 0404  WBC 20.2* 13.5*  NEUTROABS 17.1*  --   HGB 16.2 13.8  HCT 49.0 42.4  MCV 80.9 84.5  PLT 268 245   Basic Metabolic Panel: Recent Labs  Lab 11/06/18 0825 11/07/18 0404  NA 131* 134*   K 3.1* 3.2*  CL 88* 93*  CO2 26 27  GLUCOSE 160* 109*  BUN 16 15  CREATININE 0.86 0.96  CALCIUM 8.9 8.2*   GFR: Estimated Creatinine Clearance: 126.9 mL/min (by C-G formula based on SCr of 0.96 mg/dL). Liver Function Tests: Recent Labs  Lab 11/06/18 0825 11/07/18 0404  AST 97* 142*  ALT 140* 143*  ALKPHOS 430* 439*  BILITOT 7.7* 8.4*  PROT 7.1 5.7*  ALBUMIN 3.0* 2.4*   Recent Labs  Lab 11/06/18 0825  LIPASE 19   No results for input(s): AMMONIA in the last 168 hours. Coagulation Profile: Recent Labs  Lab 11/07/18 0404  INR 1.9*   Cardiac Enzymes: Recent Labs  Lab 11/06/18 1924  CKTOTAL 25*  CKMB 1.2   BNP (last 3 results) No results for input(s): PROBNP in the last 8760 hours. HbA1C: No results for input(s): HGBA1C in the last 72 hours. CBG: No results for input(s): GLUCAP in the last 168 hours. Lipid Profile: No results for input(s): CHOL, HDL, LDLCALC, TRIG, CHOLHDL, LDLDIRECT in the last 72 hours. Thyroid Function Tests: Recent Labs    11/06/18 1924  TSH 0.335*   Anemia Panel: No results for input(s): VITAMINB12, FOLATE, FERRITIN, TIBC, IRON, RETICCTPCT in the last 72 hours. Sepsis Labs: No results for input(s): PROCALCITON, LATICACIDVEN in the last 168 hours.  Recent Results (from the past 240 hour(s))  SARS Coronavirus 2 [facility] order, Performed in [facility] hospital lab) Nasopharyngeal Nasopharyngeal Swab     Status: None   Collection Time: 11/06/18 11:18 AM   Specimen: Nasopharyngeal Swab  Result Value Ref Range Status   SARS Coronavirus 2 NEGATIVE NEGATIVE Final    Comment: (NOTE) If result is NEGATIVE SARS-CoV-2 target nucleic acids are NOT DETECTED. The SARS-CoV-2 RNA is generally detectable in upper and lower  respiratory specimens during the acute phase of infection. The lowest  concentration of SARS-CoV-2 viral copies this assay can detect is 250  copies / mL. A negative result does not preclude SARS-CoV-2 infection  and  should not be used as the sole basis for treatment or other  patient management decisions.  A negative result may occur with  improper specimen collection / handling, submission of specimen other  than nasopharyngeal swab, presence of viral mutation(s) within the  areas targeted by this assay, and inadequate number of viral copies  (<250 copies / mL). A negative result must be combined with clinical  observations, patient history, and epidemiological information. If result is POSITIVE SARS-CoV-2 target nucleic acids are DETECTED. The SARS-CoV-2 RNA is generally detectable in upper and lower  respiratory specimens dur ing the acute phase of infection.  Positive  results are indicative of active infection with SARS-CoV-2.  Clinical  correlation with patient history and other diagnostic information is  necessary to determine patient infection status.  Positive results do  not rule out bacterial infection or co-infection with other viruses. If result is PRESUMPTIVE POSTIVE SARS-CoV-2 nucleic acids MAY BE  PRESENT.   A presumptive positive result was obtained on the submitted specimen  and confirmed on repeat testing.  While 2019 novel coronavirus  (SARS-CoV-2) nucleic acids may be present in the submitted sample  additional confirmatory testing may be necessary for epidemiological  and / or clinical management purposes  to differentiate between  SARS-CoV-2 and other Sarbecovirus currently known to infect humans.  If clinically indicated additional testing with an alternate test  methodology [phone]) is advised. The SARS-CoV-2 RNA is generally  detectable in upper and lower respiratory sp ecimens during the acute  phase of infection. The expected result is Negative. Fact Sheet for Patients:  StrictlyIdeas.no Fact Sheet for Healthcare Providers: BankingDealers.co.za This test is not yet approved or cleared by the [place] FDA and has been  authorized for detection and/or diagnosis of SARS-CoV-2 by FDA under an Emergency Use Authorization (EUA).  This EUA will remain in effect (meaning this test can be used) for the duration of the COVID-19 declaration under Section 564(b)(1) of the Act, 21 U.S.C. section 360bbb-3(b)(1), unless the authorization is terminated or revoked sooner. Performed at [facility], [address] [place], [place] [address]      Radiology Studies: Ct Angio Chest Pe W Or Wo Contrast  Result Date: 11/06/2018 CLINICAL DATA:  Cough and congestion for 7 days, on antibiotics, fever, dyspnea, PE suspected with positive D-dimer EXAM: CT ANGIOGRAPHY CHEST WITH CONTRAST TECHNIQUE: Multidetector CT imaging of the chest was performed using the standard protocol during bolus administration of intravenous contrast. Multiplanar CT image reconstructions and MIPs were obtained to evaluate the vascular anatomy. CONTRAST:  111mL OMNIPAQUE IOHEXOL 350 MG/ML SOLN COMPARISON:  Chest radiograph 11/06/2018 FINDINGS: Cardiovascular: Satisfactory opacification of the pulmonary arteries to the segmental level. No evidence of pulmonary embolism. No central pulmonary artery enlargement. No CT evidence of right heart strain. Normal heart size. No pericardial effusion. The aorta is normal caliber. No luminal irregularity or periaortic stranding. Common origin of the brachiocephalic and left common carotid artery. Proximal great vessels and subclavian arteries are unremarkable. Mediastinum/Nodes: No enlarged mediastinal or axillary lymph nodes. Thyroid gland, trachea, and esophagus demonstrate no significant findings. Lungs/Pleura: Dependent atelectasis is noted posteriorly. More bandlike areas of atelectasis or scarring are present in the bases. No consolidation, features of edema, pneumothorax, or effusion. No suspicious pulmonary nodules or masses. Upper Abdomen: No acute abnormalities present in the visualized portions of the upper  abdomen. Musculoskeletal: No acute osseous abnormality or suspicious osseous lesion. Mild bilateral gynecomastia. No suspicious chest wall lesions. Review of the MIP images confirms the above findings. IMPRESSION: No evidence of pulmonary embolism. No acute intrathoracic process. Electronically Signed   By: Lovena Le M.D.   On: 11/06/2018 23:43   Dg Chest Portable 1 View  Result Date: 11/06/2018 CLINICAL DATA:  Cough and fever EXAM: PORTABLE CHEST 1 VIEW COMPARISON:  None. FINDINGS: The heart size and mediastinal contours are within normal limits. Both lungs are clear. The visualized skeletal structures are unremarkable. IMPRESSION: No active disease. Electronically Signed   By: Inez Catalina M.D.   On: 11/06/2018 08:50   US Abdomen Limited Ruq  Result Date: 11/06/2018 CLINICAL DATA:  Upper abdominal pain with nausea and vomiting. Fever. EXAM: ULTRASOUND ABDOMEN LIMITED RIGHT UPPER QUADRANT COMPARISON:  None. FINDINGS: Gallbladder: No gallstones are evident. The gallbladder wall appears thickened and subtly edematous. No pericholecystic fluid evident. No sonographic Murphy sign noted by sonographer. Common bile duct: Diameter: 4 mm. No intrahepatic or extrahepatic biliary duct  dilatation. Liver: No focal lesion identified. Liver echogenicity is overall increased. There is an area of suspected fatty sparing near the gallbladder fossa. Liver measures approximately 20 cm in length. Portal vein is patent on color Doppler imaging with normal direction of blood flow towards the liver. Other: Spleen measures 17.2 x 8.7 x 16.9 cm with a measured splenic volume of 1,325 cubic cm. No focal splenic lesions evident. IMPRESSION: 1. The gallbladder wall appears thickened and subtly edematous. No gallstones evident. Question a degree of acalculus cholecystitis. This finding may warrant nuclear medicine hepatobiliary imaging study to assess for cystic duct patency. 2. Liver enlargement. The overall liver echogenicity is  increased. There is an area of decreased echogenicity near the gallbladder fossa, probably representing localized fatty sparing. Beyond apparent fatty sparing, no focal liver lesions evident. It should be noted that the sensitivity of ultrasound for detection of focal liver lesions is somewhat diminished in this circumstance. 3.  Splenomegaly.  No focal splenic lesions evident. Electronically Signed   By: Bretta BangWilliam  Woodruff III M.D.   On: 11/06/2018 10:49    Scheduled Meds:  enoxaparin (LOVENOX) injection  40 mg Subcutaneous Q24H    morphine injection  3 mg Intravenous Once   Continuous Infusions:  sodium chloride Stopped (11/06/18 1205)   piperacillin-tazobactam (ZOSYN)  IV 3.375 g (11/07/18 1427)     LOS: 1 day   Rickey BarbaraStephen Vana Arif, MD [facility] Pager On Amion  If 7PM-7AM, please contact night-coverage 11/07/2018, 4:53 PM

## 2018-11-07 NOTE — Progress Notes (Signed)
Discussion with Nuclear Med team, [place] and Dr. Leonia Reeves..Order received; did not administer Morphine 3 mg as ordered due to  on scan result. Caryl Pina, RN witnessed waste. SRP,RN

## 2018-11-07 NOTE — Progress Notes (Signed)
Received pt from Nuclear med, GI and Surgery at bedside with pt. SRP,RN

## 2018-11-07 NOTE — Consult Note (Addendum)
Gregory [name] Feb 16, 1990  [id number].    Requesting MD: Pearson Grippe Chief Complaint/Reason for Consult: acalculous cholecystitis  HPI:  This is a 28 yo white male with no significant past medical history who began having cough, chest pain, green sputum production, along with fevers up to 102 since last Wednesday.  He went to get COVID tested last Thursday which was negative.  The then began having N/V/D.  He denies any abdominal pain, reflux, or other abdominal complaints.  He denies any sick contacts or travel.  He works at a [employer], maintenance (IT).  He has continued to have these symptoms and so presented to the [facility] ED yesterday.  He was found to have a TB of over 7 with elevated LFTs, WBC of 20K along with an elevated D dimer and other labs.  He had an abdominal US that revealed no gallstones but with some gallbladder wall mildly thickened.  Acalculous cholecystitis could not be ruled out.  However, he was noted to have an enlarged liver with increased echogenicity along with splenomegaly as well.  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  Today his TB is up to 8.4 and his INR is 1.9.  We have been asked to see him to determine if he has cholecystitis.   ROS: ROS : Please see HPI, otherwise all other systems have been reviewed and are negative, except excessive dry mouth.  Family History  Problem Relation Age of Onset   Diabetes Mother    Chiari malformation Mother     Past Medical History:  Diagnosis Date   ADHD (attention deficit hyperactivity disorder)    denies taking meds   Bipolar disorder (HCC)    Pt reports that this runs in his family but he has never been officially diagnosed. Pt reports that his mom used to give him Seroquel as a child but "made me feel like a zombie so I wouldn't take it".   Headache(784.0)    Pneumonia     Past Surgical History:  Procedure Laterality Date   ANTERIOR CRUCIATE LIGAMENT REPAIR Right 07/03/2013   Procedure: RECONSTRUCTION ANTERIOR  CRUCIATE LIGAMENT (ACL) WITH HAMSTRING GRAFT;  Surgeon: [name], MD;  Location: [facility] OR;  Service: Orthopedics;  Laterality: Right;  RIGHT KNEE ANTERIOR CRUCIATE LIGAMENT RECONSTRUCTION, POSTERIOR CRUCIATE LIGAMENT RECONSTRUCTION, MENISCAL  DEBRIDEMENT, HAMSTRING AUTOGRAFT.   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 Pt reports that heart lining of cow was then attached so that tissue would expand as pt grew. Dr. Trey Sailors performed surgery.   FRACTURE SURGERY  2010   nasal fracture   KNEE SURGERY     POSTERIOR CRUCIATE LIGAMENT RECONSTRUCTION Right 07/03/2013   Procedure: RECONSTRUCTION OF FIBULAR COLLATERAL LIGAMENT;  Surgeon: [name], MD;  Location: [facility] OR;  Service: Orthopedics;  Laterality: Right;    Social History:  reports that he has never smoked. He has never used smokeless tobacco. He reports that he does not drink alcohol or use drugs.  Allergies: No Known Allergies  Medications Prior to Admission  Medication Sig Dispense Refill   cyclobenzaprine (FLEXERIL) 10 MG tablet Take 1 tablet by mouth 3 times daily as needed for muscle spasm. Warning: May cause drowsiness. (Patient not taking: Reported on 11/06/2018) 21 tablet 0   meloxicam (MOBIC) 7.5 MG tablet Take 1 tablet (7.5 mg total) by mouth daily. (Patient not taking: Reported on  11/06/2018) 30 tablet 0     Physical Exam: Blood pressure 114/77, pulse (!) 104, temperature 98.3 F (36.8 C), temperature source Oral, resp. rate 14, height 6' (1.829 m), weight (!) 185.2 kg, SpO2 93 %. General: pleasant, WD, WN white male who is laying in bed in NAD, but clearly is tired HEENT: head is normocephalic, atraumatic.  Sclera are icteric and with some injection.  PERRL.  Ears and nose without any masses or lesions.  Mouth is pink but dry Heart: regular, rate, and rhythm.  Normal s1,s2. No obvious murmurs, gallops, or rubs noted.  Palpable  radial and pedal pulses bilaterally Lungs: CTAB, no wheezes, rhonchi, or rales noted.  Respiratory effort nonlabored Abd: soft, NT, ND, +BS, no masses, hernias, or organomegaly MS: all 4 extremities are symmetrical with no cyanosis, clubbing, or edema. Skin: warm and dry with no masses, lesions, or rashes, some jaundice Psych: A&Ox3 with an appropriate affect.   Results for orders placed or performed during the hospital encounter of 11/06/18 (from the past 48 hour(s))  Comprehensive metabolic panel     Status: Abnormal   Collection Time: 11/06/18  8:25 AM  Result Value Ref Range   Sodium 131 (L) 135 - 145 mmol/L   Potassium 3.1 (L) 3.5 - 5.1 mmol/L   Chloride 88 (L) 98 - 111 mmol/L   CO2 26 22 - 32 mmol/L   Glucose, Bld 160 (H) 70 - 99 mg/dL   BUN 16 6 - 20 mg/dL   Creatinine, Ser 6.96 0.61 - 1.24 mg/dL   Calcium 8.9 8.9 - 29.5 mg/dL   Total Protein 7.1 6.5 - 8.1 g/dL   Albumin 3.0 (L) 3.5 - 5.0 g/dL   AST 97 (H) 15 - 41 U/L   ALT 140 (H) 0 - 44 U/L   Alkaline Phosphatase 430 (H) 38 - 126 U/L   Total Bilirubin 7.7 (H) 0.3 - 1.2 mg/dL   GFR calc non Af Amer >60 >60 mL/min   GFR calc Af Amer >60 >60 mL/min   Anion gap 17 (H) 5 - 15    Comment: Performed at [facility], [address] [place], [place] [address]  Lipase, blood     Status: None   Collection Time: 11/06/18  8:25 AM  Result Value Ref Range   Lipase 19 11 - 51 U/L    Comment: Performed at [facility], [address] [place], [place] [address]  CBC with Differential     Status: Abnormal   Collection Time: 11/06/18  8:25 AM  Result Value Ref Range   WBC 20.2 (H) 4.0 - 10.5 K/uL   RBC 6.06 (H) 4.22 - 5.81 MIL/uL   Hemoglobin 16.2 13.0 - 17.0 g/dL   HCT 02.7 25.3 - 66.4 %   MCV 80.9 80.0 - 100.0 fL   MCH 26.7 26.0 - 34.0 pg   MCHC 33.1 30.0 - 36.0 g/dL   RDW 40.3 47.4 - 25.9 %   Platelets 268 150 - 400 K/uL   nRBC 0.0 0.0 - 0.2 %   Neutrophils Relative % 85 %   Neutro Abs 17.1 (H) 1.7 -  7.7 K/uL   Lymphocytes Relative 7 %   Lymphs Abs 1.4 0.7 - 4.0 K/uL   Monocytes Relative 5 %   Monocytes Absolute 1.1 (H) 0.1 - 1.0 K/uL   Eosinophils Relative 1 %   Eosinophils Absolute 0.2 0.0 - 0.5 K/uL   Basophils Relative 0 %   Basophils  Absolute 0.1 0.0 - 0.1 K/uL   Immature Granulocytes 2 %   Abs Immature Granulocytes 0.41 (H) 0.00 - 0.07 K/uL    Comment: Performed at [facility], [address] [place], [place] [address]  Hepatitis panel, acute     Status: None   Collection Time: 11/06/18  8:25 AM  Result Value Ref Range   Hepatitis B Surface Ag Negative Negative   HCV Ab <0.1 0.0 - 0.9 s/co ratio    Comment: (NOTE)                                  Negative:     < 0.8                             Indeterminate: 0.8 - 0.9                                  Positive:     > 0.9 The CDC recommends that a positive HCV antibody result be followed up with a HCV Nucleic Acid Amplification test (751025). Performed At: [facility] [place], [place] [id number] Rush Farmer MD PN:[id number]    Hep A IgM Negative Negative   Hep B C IgM Negative Negative  SARS Coronavirus 2 [facility] order, Performed in [facility] hospital lab) Nasopharyngeal Nasopharyngeal Swab     Status: None   Collection Time: 11/06/18 11:18 AM   Specimen: Nasopharyngeal Swab  Result Value Ref Range   SARS Coronavirus 2 NEGATIVE NEGATIVE    Comment: (NOTE) If result is NEGATIVE SARS-CoV-2 target nucleic acids are NOT DETECTED. The SARS-CoV-2 RNA is generally detectable in upper and lower  respiratory specimens during the acute phase of infection. The lowest  concentration of SARS-CoV-2 viral copies this assay can detect is 250  copies / mL. A negative result does not preclude SARS-CoV-2 infection  and should not be used as the sole basis for treatment or other  patient management decisions.  A negative result may occur with  improper specimen collection / handling,  submission of specimen other  than nasopharyngeal swab, presence of viral mutation(s) within the  areas targeted by this assay, and inadequate number of viral copies  (<250 copies / mL). A negative result must be combined with clinical  observations, patient history, and epidemiological information. If result is POSITIVE SARS-CoV-2 target nucleic acids are DETECTED. The SARS-CoV-2 RNA is generally detectable in upper and lower  respiratory specimens dur ing the acute phase of infection.  Positive  results are indicative of active infection with SARS-CoV-2.  Clinical  correlation with patient history and other diagnostic information is  necessary to determine patient infection status.  Positive results do  not rule out bacterial infection or co-infection with other viruses. If result is PRESUMPTIVE POSTIVE SARS-CoV-2 nucleic acids MAY BE PRESENT.   A presumptive positive result was obtained on the submitted specimen  and confirmed on repeat testing.  While 2019 novel coronavirus  (SARS-CoV-2) nucleic acids may be present in the submitted sample  additional confirmatory testing may be necessary for epidemiological  and / or clinical management purposes  to differentiate between  SARS-CoV-2 and other Sarbecovirus currently known to infect humans.  If clinically indicated additional testing with an alternate test  methodology [id number]) is advised. The  SARS-CoV-2 RNA is generally  detectable in upper and lower respiratory sp ecimens during the acute  phase of infection. The expected result is Negative. Fact Sheet for Patients:  BoilerBrush.com.cy Fact Sheet for Healthcare Providers: https://pope.com/ This test is not yet approved or cleared by the [place] FDA and has been authorized for detection and/or diagnosis of SARS-CoV-2 by FDA under an Emergency Use Authorization (EUA).  This EUA will remain in effect (meaning this test can be  used) for the duration of the COVID-19 declaration under Section 564(b)(1) of the Act, 21 U.S.C. section 360bbb-3(b)(1), unless the authorization is terminated or revoked sooner. Performed at [facility], [address] [place], [place] [address]   Urinalysis, Routine w reflex microscopic     Status: Abnormal   Collection Time: 11/06/18  7:05 PM  Result Value Ref Range   Color, Urine AMBER (A) YELLOW    Comment: BIOCHEMICALS MAY BE AFFECTED BY COLOR   APPearance CLEAR CLEAR   Specific Gravity, Urine 1.026 1.005 - 1.030   pH 6.0 5.0 - 8.0   Glucose, UA NEGATIVE NEGATIVE mg/dL   Hgb urine dipstick SMALL (A) NEGATIVE   Bilirubin Urine MODERATE (A) NEGATIVE   Ketones, ur 20 (A) NEGATIVE mg/dL   Protein, ur 30 (A) NEGATIVE mg/dL   Nitrite NEGATIVE NEGATIVE   Leukocytes,Ua NEGATIVE NEGATIVE   RBC / HPF 0-5 0 - 5 RBC/hpf   WBC, UA 11-20 0 - 5 WBC/hpf   Bacteria, UA RARE (A) NONE SEEN   Mucus PRESENT     Comment: Performed at [facility], [address] [place], [place] [address]  TSH     Status: Abnormal   Collection Time: 11/06/18  7:24 PM  Result Value Ref Range   TSH 0.335 (L) 0.350 - 4.500 uIU/mL    Comment: Performed by a 3rd Generation assay with a functional sensitivity of <=0.01 uIU/mL. Performed at [facility], [address], [place], [place] [address]   D-dimer, quantitative (not at [facility])     Status: Abnormal   Collection Time: 11/06/18  7:24 PM  Result Value Ref Range   D-Dimer, Quant 2.34 (H) 0.00 - 0.50 ug/mL-FEU    Comment: (NOTE) At the manufacturer cut-off of 0.50 ug/mL FEU, this assay has been documented to exclude PE with a sensitivity and negative predictive value of 97 to 99%.  At this time, this assay has not been approved by the FDA to exclude DVT/VTE. Results should be correlated with clinical presentation. Performed at [facility], [address], [place], [place] [address]   CK  total and CKMB (cardiac)not at [facility]     Status: Abnormal   Collection Time: 11/06/18  7:24 PM  Result Value Ref Range   Total CK 25 (L) 49 - 397 U/L   CK, MB 1.2 0.5 - 5.0 ng/mL   Relative Index RELATIVE INDEX IS INVALID 0.0 - 2.5    Comment: WHEN CK < 100 U/L        Performed at [facility], [address], [place], [place] [address]   Comprehensive metabolic panel     Status: Abnormal   Collection Time: 11/07/18  4:04 AM  Result Value Ref Range   Sodium 134 (L) 135 - 145 mmol/L   Potassium 3.2 (L) 3.5 - 5.1 mmol/L   Chloride 93 (L) 98 - 111 mmol/L   CO2 27 22 - 32 mmol/L   Glucose, Bld 109 (H) 70 -  99 mg/dL   BUN 15 6 - 20 mg/dL   Creatinine, Ser 1.610.96 0.61 - 1.24 mg/dL   Calcium 8.2 (L) 8.9 - 10.3 mg/dL   Total Protein 5.7 (L) 6.5 - 8.1 g/dL   Albumin 2.4 (L) 3.5 - 5.0 g/dL   AST 096142 (H) 15 - 41 U/L   ALT 143 (H) 0 - 44 U/L   Alkaline Phosphatase 439 (H) 38 - 126 U/L   Total Bilirubin 8.4 (H) 0.3 - 1.2 mg/dL   GFR calc non Af Amer >60 >60 mL/min   GFR calc Af Amer >60 >60 mL/min   Anion gap 14 5 - 15    Comment: Performed at [facility], [address] [place], [place] [address]  CBC     Status: Abnormal   Collection Time: 11/07/18  4:04 AM  Result Value Ref Range   WBC 13.5 (H) 4.0 - 10.5 K/uL   RBC 5.02 4.22 - 5.81 MIL/uL   Hemoglobin 13.8 13.0 - 17.0 g/dL   HCT 98.142.4 19.139.0 - 47.852.0 %   MCV 84.5 80.0 - 100.0 fL   MCH 27.5 26.0 - 34.0 pg   MCHC 32.5 30.0 - 36.0 g/dL   RDW 29.513.0 62.111.5 - 30.815.5 %   Platelets 245 150 - 400 K/uL   nRBC 0.0 0.0 - 0.2 %    Comment: Performed at [facility], [address] [place], [place] [address]  Sedimentation rate     Status: Abnormal   Collection Time: 11/07/18  4:04 AM  Result Value Ref Range   Sed Rate 51 (H) 0 - 16 mm/hr    Comment: Performed at [facility], [address] [place], [place] [address]  Protime-INR     Status: Abnormal   Collection Time: 11/07/18  4:04  AM  Result Value Ref Range   Prothrombin Time 21.5 (H) 11.4 - 15.2 seconds   INR 1.9 (H) 0.8 - 1.2    Comment: (NOTE) INR goal varies based on device and disease states. Performed at [facility], [address] [place], [place] [address]   APTT     Status: None   Collection Time: 11/07/18  4:04 AM  Result Value Ref Range   aPTT 32 24 - 36 seconds    Comment: Performed at [facility], [address] [place], [place] [address]   Ct Angio Chest Pe W Or Wo Contrast  Result Date: 11/06/2018 CLINICAL DATA:  Cough and congestion for 7 days, on antibiotics, fever, dyspnea, PE suspected with positive D-dimer EXAM: CT ANGIOGRAPHY CHEST WITH CONTRAST TECHNIQUE: Multidetector CT imaging of the chest was performed using the standard protocol during bolus administration of intravenous contrast. Multiplanar CT image reconstructions and MIPs were obtained to evaluate the vascular anatomy. CONTRAST:  100mL OMNIPAQUE IOHEXOL 350 MG/ML SOLN COMPARISON:  Chest radiograph 11/06/2018 FINDINGS: Cardiovascular: Satisfactory opacification of the pulmonary arteries to the segmental level. No evidence of pulmonary embolism. No central pulmonary artery enlargement. No CT evidence of right heart strain. Normal heart size. No pericardial effusion. The aorta is normal caliber. No luminal irregularity or periaortic stranding. Common origin of the brachiocephalic and left common carotid artery. Proximal great vessels and subclavian arteries are unremarkable. Mediastinum/Nodes: No enlarged mediastinal or axillary lymph nodes. Thyroid gland, trachea, and esophagus demonstrate no significant findings. Lungs/Pleura: Dependent atelectasis is noted posteriorly. More bandlike areas of atelectasis or scarring are present in the bases. No consolidation, features of edema, pneumothorax, or  effusion. No suspicious pulmonary nodules or masses. Upper Abdomen: No acute abnormalities present in the  visualized portions of the upper abdomen. Musculoskeletal: No acute osseous abnormality or suspicious osseous lesion. Mild bilateral gynecomastia. No suspicious chest wall lesions. Review of the MIP images confirms the above findings. IMPRESSION: No evidence of pulmonary embolism. No acute intrathoracic process. Electronically Signed   By: Kreg Shropshire M.D.   On: 11/06/2018 23:43   Dg Chest Portable 1 View  Result Date: 11/06/2018 CLINICAL DATA:  Cough and fever EXAM: PORTABLE CHEST 1 VIEW COMPARISON:  None. FINDINGS: The heart size and mediastinal contours are within normal limits. Both lungs are clear. The visualized skeletal structures are unremarkable. IMPRESSION: No active disease. Electronically Signed   By: Alcide Clever M.D.   On: 11/06/2018 08:50   US Abdomen Limited Ruq  Result Date: 11/06/2018 CLINICAL DATA:  Upper abdominal pain with nausea and vomiting. Fever. EXAM: ULTRASOUND ABDOMEN LIMITED RIGHT UPPER QUADRANT COMPARISON:  None. FINDINGS: Gallbladder: No gallstones are evident. The gallbladder wall appears thickened and subtly edematous. No pericholecystic fluid evident. No sonographic Murphy sign noted by sonographer. Common bile duct: Diameter: 4 mm. No intrahepatic or extrahepatic biliary duct dilatation. Liver: No focal lesion identified. Liver echogenicity is overall increased. There is an area of suspected fatty sparing near the gallbladder fossa. Liver measures approximately 20 cm in length. Portal vein is patent on color Doppler imaging with normal direction of blood flow towards the liver. Other: Spleen measures 17.2 x 8.7 x 16.9 cm with a measured splenic volume of 1,325 cubic cm. No focal splenic lesions evident. IMPRESSION: 1. The gallbladder wall appears thickened and subtly edematous. No gallstones evident. Question a degree of acalculus cholecystitis. This finding may warrant nuclear medicine hepatobiliary imaging study to assess for cystic duct patency. 2. Liver enlargement.  The overall liver echogenicity is increased. There is an area of decreased echogenicity near the gallbladder fossa, probably representing localized fatty sparing. Beyond apparent fatty sparing, no focal liver lesions evident. It should be noted that the sensitivity of ultrasound for detection of focal liver lesions is somewhat diminished in this circumstance. 3.  Splenomegaly.  No focal splenic lesions evident. Electronically Signed   By: Bretta Bang III M.D.   On: 11/06/2018 10:49      Assessment/Plan Elevated LFTs - TB up to 8.4 today.  This is unlikely to be related to his gallbladder as he does not have any stones.  This would more likely be from hepatic dysfunction Elevated INR - 1.9.  He is not on anticoagulation and expect this is also secondary to liver dysfunction. Elevated D-dimer - CTA negative for PE.  COVID negative x2.  Gallbladder wall thickening  There is some mild wall thickening noted on the [place]; however there is hepatomegaly along with increased echogenicity of his liver c/w some type of hepatic dysfunction, suspected to be related to a virus, whether hepatitis or some other virus.  He does not have symptoms or a story consistent with cholecystitis.  He denies ever having abdominal pain and currently does not on exam.  It is also unusual for an otherwise health young man to have acalculous cholecystitis.  Elevated in TB would not be c/w acalculous cholecystitis as the patient doesn't have gallstones to cause this elevation.  He currently have a hepatitis panel pending.  His last tattoo was over 4 yrs ago.  He is also scheduled for a HIDA today.  We will continue to follow along, but do not expect  that this patient has acalculous cholecystitis.  Would recommend GI consult as well given liver dysfunction noted on labs.   FEN - NPO for HIDA VTE - per medicine ID - [email or web address]  Letha Cape, [facility] 11/07/2018, 9:35 AM Pager: [phone]

## 2018-11-07 NOTE — Consult Note (Signed)
Referring Provider:   [facility]        Primary Care Physician:  Patient, No Pcp Per Primary Gastroenterologist:  unassigned           Reason for Consultation:    Abnormal liver tests               ASSESSMENT /  PLAN    1. 28 yo health male with fevers / leukocytosis with left shift / splenomegaly /headaches / head congestion / cough and markedly abnormal liver tests in a mixed pattern. There was concern for acalculous cholecystitis on RUQ. HIDA pending but given presentation cholecystitis seems unlikely. Presentation seems most likely to be infectious.   --No recent tick bites  --COVID negative.  --Acute hepatitis panel negative.  --HIV negative --Check EBV IgM --Check CMV IgM --CT angio for cough / elevated D-dimer didn't show any pulmonary         lesions.  --Normal mentation. Monitor INR, it is elevated at 1.9 today   2. ? Hyperthyroidism. TSH slightly low. Is this even reliable in setting of acute illness  HPI:     Gregory [name] is a 28 y.o. male with a history of chiari malformation and ADD who several days ago began having intermittent fevers > 101. No associated symptoms except cough / congesting until taking a COVID test on Thursday (negative). Following that he developed nausea, vomiting and and diarrhea. Never did have abdominal pain except when his child jumped on his stomach over the weekend but that pain resolved. No muscle or joint pains. He endorses a rash which started on shoulder and spread to large area on his back but resolved now. No tick or other insect bites. Patient took a Zpak for ? Sinus but ended up coming to ED yesterday because he felt so poor. No recent travel. No contract with anyone known to be ill. Patient doesn't take any medications, OTC or Rx on a regular basis. Other than z-pak he recently too one dose of Flexeril for shoulder pain. He does not use illicit drugs.  Had mono in high school  Past Medical History:  Diagnosis Date   ADHD  (attention deficit hyperactivity disorder)    denies taking meds   Bipolar disorder ([place])    Pt reports that this runs in his family but he has never been officially diagnosed. Pt reports that his mom used to give him Seroquel as a child but "made me feel like a zombie so I wouldn't take it".   Headache(784.0)    Pneumonia     Past Surgical History:  Procedure Laterality Date   ANTERIOR CRUCIATE LIGAMENT REPAIR Right 07/03/2013   Procedure: RECONSTRUCTION ANTERIOR CRUCIATE LIGAMENT (ACL) WITH HAMSTRING GRAFT;  Surgeon: Meredith Pel, MD;  Location: [facility];  Service: Orthopedics;  Laterality: Right;  RIGHT KNEE ANTERIOR CRUCIATE LIGAMENT RECONSTRUCTION, POSTERIOR CRUCIATE LIGAMENT RECONSTRUCTION, MENISCAL  DEBRIDEMENT, HAMSTRING AUTOGRAFT.   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 Pt reports that heart lining of cow was then attached so that tissue would expand as pt grew. Dr. Glenna Fellows performed surgery.   FRACTURE SURGERY  2010   nasal fracture   KNEE SURGERY     POSTERIOR CRUCIATE LIGAMENT RECONSTRUCTION Right 07/03/2013   Procedure: RECONSTRUCTION OF FIBULAR COLLATERAL LIGAMENT;  Surgeon: Meredith Pel, MD;  Location: [facility];  Service: Orthopedics;  Laterality: Right;  Prior to Admission medications   Medication Sig Start Date End Date Taking? Authorizing Provider  cyclobenzaprine (FLEXERIL) 10 MG tablet Take 1 tablet by mouth 3 times daily as needed for muscle spasm. Warning: May cause drowsiness. Patient not taking: Reported on 11/06/2018 02/21/18   Mardella LaymanHagler, Brian, MD  meloxicam (MOBIC) 7.5 MG tablet Take 1 tablet (7.5 mg total) by mouth daily. Patient not taking: Reported on 11/06/2018 11/06/17   Wallis BambergMani, Mario, PA-C    Current Facility-Administered Medications  Medication Dose Route Frequency Provider Last Rate Last Dose   0.9 %  sodium chloride infusion   Intravenous PRN Long, Arlyss RepressJoshua G, MD    Stopped at 11/06/18 1205   acetaminophen (TYLENOL) tablet 650 mg  650 mg Oral Q6H PRN Pearson GrippeKim, James, MD       Or   acetaminophen (TYLENOL) suppository 650 mg  650 mg Rectal Q6H PRN Pearson GrippeKim, James, MD       enoxaparin (LOVENOX) injection 40 mg  40 mg Subcutaneous Q24H Pearson GrippeKim, James, MD   40 mg at 11/07/18 0006   piperacillin-tazobactam (ZOSYN) IVPB 3.375 g  3.375 g Intravenous Q8H Danford BadWofford, Drew A, RPH 12.5 mL/hr at 11/07/18 0525 3.375 g at 11/07/18 0525   potassium chloride 10 mEq in 100 mL IVPB  10 mEq Intravenous Q1 Hr x 6 Jerald Kiefhiu, Stephen K, MD 100 mL/hr at 11/07/18 1018 10 mEq at 11/07/18 1018    Allergies as of 11/06/2018   (No Known Allergies)    Family History  Problem Relation Age of Onset   Diabetes Mother    Chiari malformation Mother     Social History   Socioeconomic History   Marital status: Single    Spouse name: Not on file   Number of children: Not on file   Years of education: Not on file   Highest education level: Not on file  Occupational History   Not on file  Social Needs   Financial resource strain: Not on file   Food insecurity    Worry: Not on file    Inability: Not on file   Transportation needs    Medical: Not on file    Non-medical: Not on file  Tobacco Use   Smoking status: Never Smoker   Smokeless tobacco: Never Used  Substance and Sexual Activity   Alcohol use: No   Drug use: No   Sexual activity: Not on file  Lifestyle   Physical activity    Days per week: Not on file    Minutes per session: Not on file   Stress: Not on file  Relationships   Social connections    Talks on phone: Not on file    Gets together: Not on file    Attends religious service: Not on file    Active member of club or organization: Not on file    Attends meetings of clubs or organizations: Not on file    Relationship status: Not on file   Intimate partner violence    Fear of current or ex partner: Not on file    Emotionally abused: Not on file     Physically abused: Not on file    Forced sexual activity: Not on file  Other Topics Concern   Not on file  Social History Narrative   Not on file    Review of Systems: All systems reviewed and negative except where noted in HPI.  Physical Exam: Vital signs in last 24 hours: Temp:  [97.6 F (36.4 C)-100.3 F (37.9 C)]  98.3 F (36.8 C) (09/29 0630) Pulse Rate:  [102-106] 104 (09/29 0630) Resp:  [14-20] 14 (09/29 0630) BP: (113-132)/(77-83) 114/77 (09/29 0630) SpO2:  [93 %-97 %] 93 % (09/29 0630) Weight:  [79.2 kg-79.7 kg] 79.2 kg (09/29 0500) Last BM Date: 11/06/18 General:   Alert, well-developed,  male in NAD Psych:  Pleasant, cooperative. Normal mood and affect. Eyes:  Pupils equal, sclera clear, no icterus.   Conjunctiva pink. Ears:  Normal auditory acuity. Nose:  No deformity, discharge,  or lesions. Neck:  Supple; no masses Lungs:  Clear throughout to auscultation.   No wheezes, crackles, or rhonchi.  Heart:  Regular rate and rhythm; no murmurs, no lower extremity edema Abdomen:  Soft, non-distended, nontender, BS active, no palp mass   Rectal:  Deferred  Msk:  Symmetrical without gross deformities. . Neurologic:  Alert and  oriented x4;  grossly normal neurologically. Skin:  Intact without significant lesions or rashes. Multiple tatoos   Intake/Output from previous day: 09/28 0701 - 09/29 0700 In: 2325.1 [I.V.:821; IV Piggyback:1504.1] Out: -  Intake/Output this shift: No intake/output data recorded.  Lab Results: Recent Labs    11/06/18 0825 11/07/18 0404  WBC 20.2* 13.5*  HGB 16.2 13.8  HCT 49.0 42.4  PLT 268 245   BMET Recent Labs    11/06/18 0825 11/07/18 0404  NA 131* 134*  K 3.1* 3.2*  CL 88* 93*  CO2 26 27  GLUCOSE 160* 109*  BUN 16 15  CREATININE 0.86 0.96  CALCIUM 8.9 8.2*   LFT Recent Labs    11/07/18 0404  PROT 5.7*  ALBUMIN 2.4*  AST 142*  ALT 143*  ALKPHOS 439*  BILITOT 8.4*   PT/INR Recent Labs    11/07/18 0404    LABPROT 21.5*  INR 1.9*   Hepatitis Panel Recent Labs    11/06/18 0825  HEPBSAG Negative  HCVAB <0.1  HEPAIGM Negative  HEPBIGM Negative     . CBC Latest Ref Rng & Units 11/07/2018 11/06/2018 06/25/2013  WBC 4.0 - 10.5 K/uL 13.5(H) 20.2(H) 6.4  Hemoglobin 13.0 - 17.0 g/dL 16.1 09.6 04.5  Hematocrit 39.0 - 52.0 % 42.4 49.0 45.2  Platelets 150 - 400 K/uL 245 268 265    . CMP Latest Ref Rng & Units 11/07/2018 11/06/2018  Glucose 70 - 99 mg/dL 409(W) 119(J)  BUN 6 - 20 mg/dL 15 16  Creatinine 4.78 - 1.24 mg/dL 2.95 6.21  Sodium 308 - 145 mmol/L 134(L) 131(L)  Potassium 3.5 - 5.1 mmol/L 3.2(L) 3.1(L)  Chloride 98 - 111 mmol/L 93(L) 88(L)  CO2 22 - 32 mmol/L 27 26  Calcium 8.9 - 10.3 mg/dL 8.2(L) 8.9  Total Protein 6.5 - 8.1 g/dL 6.5(H) 7.1  Total Bilirubin 0.3 - 1.2 mg/dL 8.4(O) 7.7(H)  Alkaline Phos 38 - 126 U/L 439(H) 430(H)  AST 15 - 41 U/L 142(H) 97(H)  ALT 0 - 44 U/L 143(H) 140(H)   Studies/Results: Ct Angio Chest Pe W Or Wo Contrast  Result Date: 11/06/2018 CLINICAL DATA:  Cough and congestion for 7 days, on antibiotics, fever, dyspnea, PE suspected with positive D-dimer EXAM: CT ANGIOGRAPHY CHEST WITH CONTRAST TECHNIQUE: Multidetector CT imaging of the chest was performed using the standard protocol during bolus administration of intravenous contrast. Multiplanar CT image reconstructions and MIPs were obtained to evaluate the vascular anatomy. CONTRAST:  OMNIPAQUE IOHEXOL 350 MG/ML SOLN COMPARISON:  Chest radiograph 11/06/2018 FINDINGS: Cardiovascular: Satisfactory opacification of the pulmonary arteries to the segmental level. No evidence of pulmonary embolism.  No central pulmonary artery enlargement. No CT evidence of right heart strain. Normal heart size. No pericardial effusion. The aorta is normal caliber. No luminal irregularity or periaortic stranding. Common origin of the brachiocephalic and left common carotid artery. Proximal great vessels and subclavian  arteries are unremarkable. Mediastinum/Nodes: No enlarged mediastinal or axillary lymph nodes. Thyroid gland, trachea, and esophagus demonstrate no significant findings. Lungs/Pleura: Dependent atelectasis is noted posteriorly. More bandlike areas of atelectasis or scarring are present in the bases. No consolidation, features of edema, pneumothorax, or effusion. No suspicious pulmonary nodules or masses. Upper Abdomen: No acute abnormalities present in the visualized portions of the upper abdomen. Musculoskeletal: No acute osseous abnormality or suspicious osseous lesion. Mild bilateral gynecomastia. No suspicious chest wall lesions. Review of the MIP images confirms the above findings. IMPRESSION: No evidence of pulmonary embolism. No acute intrathoracic process. Electronically Signed   By: Kreg Shropshire M.D.   On: 11/06/2018 23:43   Dg Chest Portable 1 View  Result Date: 11/06/2018 CLINICAL DATA:  Cough and fever EXAM: PORTABLE CHEST 1 VIEW COMPARISON:  None. FINDINGS: The heart size and mediastinal contours are within normal limits. Both lungs are clear. The visualized skeletal structures are unremarkable. IMPRESSION: No active disease. Electronically Signed   By: Alcide Clever M.D.   On: 11/06/2018 08:50   US Abdomen Limited Ruq  Result Date: 11/06/2018 CLINICAL DATA:  Upper abdominal pain with nausea and vomiting. Fever. EXAM: ULTRASOUND ABDOMEN LIMITED RIGHT UPPER QUADRANT COMPARISON:  None. FINDINGS: Gallbladder: No gallstones are evident. The gallbladder wall appears thickened and subtly edematous. No pericholecystic fluid evident. No sonographic Murphy sign noted by sonographer. Common bile duct: Diameter: 4 mm. No intrahepatic or extrahepatic biliary duct dilatation. Liver: No focal lesion identified. Liver echogenicity is overall increased. There is an area of suspected fatty sparing near the gallbladder fossa. Liver measures approximately 20 cm in length. Portal vein is patent on color Doppler  imaging with normal direction of blood flow towards the liver. Other: Spleen measures 17.2 x 8.7 x 16.9 cm with a measured splenic volume of 1,325 cubic cm. No focal splenic lesions evident. IMPRESSION: 1. The gallbladder wall appears thickened and subtly edematous. No gallstones evident. Question a degree of acalculus cholecystitis. This finding may warrant nuclear medicine hepatobiliary imaging study to assess for cystic duct patency. 2. Liver enlargement. The overall liver echogenicity is increased. There is an area of decreased echogenicity near the gallbladder fossa, probably representing localized fatty sparing. Beyond apparent fatty sparing, no focal liver lesions evident. It should be noted that the sensitivity of ultrasound for detection of focal liver lesions is somewhat diminished in this circumstance. 3.  Splenomegaly.  No focal splenic lesions evident. Electronically Signed   By: Bretta Bang III M.D.   On: 11/06/2018 10:49    Principal Problem:   Sepsis (HCC) Active Problems:   Abnormal liver function   Leukocytosis   Acute lower UTI   Hypokalemia    Willette Cluster, NP-C @  11/07/2018, 12:41 PM

## 2018-11-07 NOTE — Progress Notes (Signed)
PHARMACY NOTE -  [facility] has been assisting with dosing of Zosyn for sepsis with suspected intra-abdominal source. Dosage remains stable at Zosyn 3.375gm IV Q8h to be infused over 4hrs and need for further dosage adjustment appears unlikely at present.    Excellent renal function, CrCl>136ml/min  Will sign off at this time.  Please reconsult if a change in clinical status warrants re-evaluation of dosage.  Netta Cedars, PharmD, BCPS 11/07/2018@8 :27 AM

## 2018-11-07 NOTE — Progress Notes (Signed)
Received called from [facility], [place] and Dr. Leonia Reeves, order received to administered Morphine 3 mg IV once for HIDA procedure. SRP, RN

## 2018-11-08 ENCOUNTER — Encounter (HOSPITAL_COMMUNITY): Payer: Self-pay | Admitting: Internal Medicine

## 2018-11-08 DIAGNOSIS — K7200 Acute and subacute hepatic failure without coma: Secondary | ICD-10-CM

## 2018-11-08 DIAGNOSIS — N390 Urinary tract infection, site not specified: Secondary | ICD-10-CM

## 2018-11-08 DIAGNOSIS — R1110 Vomiting, unspecified: Secondary | ICD-10-CM

## 2018-11-08 LAB — CMV IGM: CMV IgM: <30 AU/mL (ref 0.0–29.9)

## 2018-11-08 LAB — MONONUCLEOSIS SCREEN: Mono Screen: NEGATIVE

## 2018-11-08 LAB — ANTI-SMOOTH MUSCLE ANTIBODY, IGG: F-Actin IgG: 9 Units (ref 0–19)

## 2018-11-08 LAB — COMPREHENSIVE METABOLIC PANEL
ALT: 167 U/L — ABNORMAL HIGH (ref 0–44)
AST: 173 U/L — ABNORMAL HIGH (ref 15–41)
Albumin: 2.3 g/dL — ABNORMAL LOW (ref 3.5–5.0)
Alkaline Phosphatase: 579 U/L — ABNORMAL HIGH (ref 38–126)
Anion gap: 10 (ref 5–15)
BUN: 11 mg/dL (ref 6–20)
CO2: 27 mmol/L (ref 22–32)
Calcium: 8.1 mg/dL — ABNORMAL LOW (ref 8.9–10.3)
Chloride: 95 mmol/L — ABNORMAL LOW (ref 98–111)
Creatinine, Ser: 0.79 mg/dL (ref 0.61–1.24)
GFR calc Af Amer: >60 mL/min (ref >60)
GFR calc non Af Amer: >60 mL/min (ref >60)
Glucose, Bld: 110 mg/dL — ABNORMAL HIGH (ref 70–99)
Potassium: 3.4 mmol/L — ABNORMAL LOW (ref 3.5–5.1)
Sodium: 132 mmol/L — ABNORMAL LOW (ref 135–145)
Total Bilirubin: 9.1 mg/dL — ABNORMAL HIGH (ref 0.3–1.2)
Total Protein: 5.6 g/dL — ABNORMAL LOW (ref 6.5–8.1)

## 2018-11-08 LAB — ANTINUCLEAR ANTIBODIES, IFA: ANA Ab, IFA: NEGATIVE

## 2018-11-08 LAB — PROTIME-INR
INR: 2.1 — ABNORMAL HIGH (ref 0.8–1.2)
INR: 1.6 — ABNORMAL HIGH (ref 0.8–1.2)
Prothrombin Time: 23.5 seconds — ABNORMAL HIGH (ref 11.4–15.2)
Prothrombin Time: 18.9 seconds — ABNORMAL HIGH (ref 11.4–15.2)

## 2018-11-08 LAB — CBC
HCT: 40 % (ref 39.0–52.0)
Hemoglobin: 12.7 g/dL — ABNORMAL LOW (ref 13.0–17.0)
MCH: 27 pg (ref 26.0–34.0)
MCHC: 31.8 g/dL (ref 30.0–36.0)
MCV: 84.9 fL (ref 80.0–100.0)
Platelets: 237 10*3/uL (ref 150–400)
RBC: 4.71 MIL/uL (ref 4.22–5.81)
RDW: 13.2 % (ref 11.5–15.5)
WBC: 14.8 10*3/uL — ABNORMAL HIGH (ref 4.0–10.5)
nRBC: 0 % (ref 0.0–0.2)

## 2018-11-08 LAB — T4, FREE: Free T4: 1.41 ng/dL — ABNORMAL HIGH (ref 0.61–1.12)

## 2018-11-08 LAB — IGG: IgG (Immunoglobin G), Serum: 876 mg/dL (ref 603–1613)

## 2018-11-08 LAB — EPSTEIN-BARR VIRUS VCA, IGM: EBV VCA IgM: <36 U/mL (ref 0.0–35.9)

## 2018-11-08 LAB — ACETAMINOPHEN LEVEL: Acetaminophen (Tylenol), Serum: <10 ug/mL — ABNORMAL LOW (ref 10–30)

## 2018-11-08 MED ORDER — POTASSIUM CHLORIDE CRYS ER 20 MEQ PO TBCR
30.0000 meq | EXTENDED_RELEASE_TABLET | ORAL | Status: AC
  Administered 2018-11-08 (×2): 30 meq via ORAL
  Filled 2018-11-08 (×2): qty 1

## 2018-11-08 MED ORDER — DEXTROSE-NACL 5-0.45 % IV SOLN
INTRAVENOUS | Status: DC
  Administered 2018-11-08 – 2018-11-10 (×4): via INTRAVENOUS

## 2018-11-08 MED ORDER — DEXTROSE-NACL 2.5-0.45 % IV SOLN: INTRAVENOUS | Status: DC

## 2018-11-08 MED ORDER — VITAMIN K1 10 MG/ML IJ SOLN
5.0000 mg | Freq: Every day | INTRAMUSCULAR | Status: AC
  Administered 2018-11-08 – 2018-11-10 (×3): 5 mg via SUBCUTANEOUS
  Filled 2018-11-08 (×3): qty 0.5

## 2018-11-08 MED ORDER — POLYVINYL ALCOHOL 1.4 % OP SOLN
1.0000 [drp] | OPHTHALMIC | Status: DC | PRN
  Filled 2018-11-08: qty 15

## 2018-11-08 MED ORDER — LIP MEDEX EX OINT
TOPICAL_OINTMENT | CUTANEOUS | Status: DC | PRN
  Filled 2018-11-08: qty 7

## 2018-11-08 NOTE — Plan of Care (Signed)

## 2018-11-08 NOTE — Progress Notes (Signed)
PROGRESS NOTE    Gregory [name]  ZOX:[id number] DOB: [birth year] DOA: 11/06/2018 PCP: Patient, No Pcp Per   Brief Narrative:   Gregory [name] is a 28 year old male with past medical history of Chari malformation, ADD, bipolar disorder scented to the ED with fever, chills, cough associated with nausea, vomiting and diarrhea since last Thursday.  He also endorsed some sinus pressure.  He was tested for COVID-19 on Thursday which was negative.  He had a hold of Z-Pak and started a 5-day course without much effect.  He also was taking several doses of Tylenol every 4-6 hours for 4 to 5 days, and DayQuil/NyQuil.  Patient currently works in an [employer], denies any known sick contacts.  Only occupational exposures are occasional urethane and primers.  His sister has reported Crohn's disease.  Previously endorsed cocaine use, but has been clean for 7 years.  Also denies any significant alcohol use, only reports drinking once yearly on his birthday.  In the ED, his temperature was noted to be 98.6, heart rate 120, RR 18, BP 134/106, Pox 100% on room air.  Labs significant for elevated AST 97, ALT 140, total bilirubin 7.7, alkaline phosphatase 430.  WBC count of 20.2.  Dimer 2.34, TSH 0.335.  Urinalysis with moderate bilirubin, 20 ketones, negative nitrite/leukoesterase, rare bacteria, 11-20 WBCs.  Chest x-ray without active disease process.  Right upper quadrant ultrasound with gallbladder wall thickening and slightly edematous, no gallstones evident.  Liver enlargement and splenomegaly.  EDP consulted general surgery for evaluation of gallbladder abnormalities on imaging and requested hospitalist admission for further evaluation and treatment for transaminitis with hyperbilirubinemia.  Assessment & Plan:   Principal Problem:   Sepsis ([place]) Active Problems:   Abnormal liver function   Leukocytosis   Acute lower UTI   Hypokalemia   Non-intractable vomiting   Splenomegaly   Fever,  unspecified   Sepsis, present on admission UTI versus acalculous cholecystitis Patient presenting with fever, chills.  Noted to have elevated WBC count of 20 K.  Urinalysis with negative nitrite/leukoesterase but with 11-20 WBCs.  Right upper quadrant ultrasound noticeable for gallbladder wall thickening/edematous, increased liver echogenicity concerning for a calculus cholecystitis.  General surgery was consulted and recommended HIDA scan.  HIDA scan was notable for delayed/impaired transit of radiotracer from the liver into the bowel with differential including hepatic dysfunction versus biliary obstruction.  General surgery does not believe this is related to gallbladder pathology and recommends gastroenterology input. --Gastroenterology consult and following, appreciate assistance --Patient has been afebrile since admission, WBC count down to 14.8. --We will stop Zosyn at this point, as review of literature notable for possible drug-induced liver injury with this medication --Continue to monitor fever curve, trend CBC --If fever recurs or white count worsens, will consider repeating urinalysis with culture and blood cultures x2 and consulting infectious disease for further guidance  Transaminitis Hepatitis, unclear cause Hyperbilirubinemia Coagulopathy Ultrasound right upper quadrant notable for increased echogenicity of liver with splenomegaly.  Initially concern for possible acalculous cholecystitis as cause, but seems to be less viable with HIDA scan results.  ESR elevated at 51. CMV less than 30, within normal limits.  ANA negative.  IgG 876 within normal limits.  Mono negative.  Acute hepatitis panel negative. HIV non-reactive.  Tylenol level within normal limits. --AST 97, 142, 177 --ALT 140, 143, 167 --AP 430, 439, 579 --Tbili 7.7, 8.4, 9.1 --AsMA, EBV PCR pending --CMV quantitative PCR pending --Antimitochondrial antibodies: Pending --adenovirus antibodies:  Pending --Ceruloplasmin: Pending --vit  K 82m [place] daily x 3 days per GI --Continue to trend LFTs, INR  Hypokalemia Repleted. --Continue to monitor electrolytes to include magnesium daily.  Low TSH TSH 0.335, free T4 elevated at 1.41.  Unclear significance. --Recommend repeat TFTs in 4-6 weeks.   DVT prophylaxis: Lovenox Code Status: Full code Family Communication: None Disposition Plan: Continue inpatient, anticipate discharge home when medically ready   Consultants:   General surgery: Signed off 11/08/2018  [facility]   Procedures:  Transthoracic echocardiogram 11/07/2018: IMPRESSIONS    1. Left ventricular ejection fraction, by visual estimation, is 50 to 55%. The left ventricle has normal function. Normal left ventricular size. There is no left ventricular hypertrophy.  2. Global right ventricle has normal systolic function.The right ventricular size is normal.  3. Left atrial size was normal.  4. Right atrial size was normal.  5. The mitral valve is normal in structure. Trace mitral valve regurgitation. No evidence of mitral stenosis.  6. The tricuspid valve is normal in structure. Tricuspid valve regurgitation is trivial.  7. The aortic valve was not well visualized Aortic valve regurgitation was not visualized by color flow Doppler. Structurally normal aortic valve, with no evidence of sclerosis or stenosis.  8. The pulmonic valve was normal in structure. Pulmonic valve regurgitation is not visualized by color flow Doppler.  9. The inferior vena cava is normal in size with greater than 50% respiratory variability, suggesting right atrial pressure of 3 mmHg. 10. Low normal LV systolic function; no significant valvular heart disease.  Antimicrobials:  Zosyn 9/28 - 9/30   Subjective: Patient seen and examined at bedside, resting comfortably.  Tolerating diet.  Nausea/vomiting have resolved.  Although continues with increasing liver enzymes/T bilirubin and INR.  Seen  by general surgery this morning, does not believe any of his symptoms related to gallbladder findings on ultrasound.  No other significant complaints or concerns at this time.  Denies headache, no fever/chills/night sweats, no nausea cefonicid diarrhea, no chest pain, no palpitations, no shortness of breath, no weakness, no fatigue, no paresthesias.  No acute events overnight per nursing staff.  Objective: Vitals:   11/07/18 2141 11/08/18 0449 11/08/18 0518 11/08/18 1533  BP: 120/78 119/81  122/79  Pulse: (!) 109 (!) 106  (!) 110  Resp: _0 Temp: 99 F (37.2 C) 98.4 F (36.9 C)  99.9 F (37.7 C)  TempSrc: Oral Oral  Oral  SpO2: 100% 94%  93%  Weight:   82.8 kg   Height:        Intake/Output Summary (Last 24 hours) at 11/08/2018 1632 Last data filed at 11/08/2018 0600 Gross per 24 hour  Intake 108.7 ml  Output --  Net 108.7 ml   Filed Weights   11/06/18 1756 11/07/18 0500 11/08/18 0518  Weight: 79.7 kg 79.2 kg 82.8 kg    Examination:  General exam: Appears calm and comfortable  Respiratory system: Clear to auscultation. Respiratory effort normal. Cardiovascular system: S1 & S2 heard, RRR. No JVD, murmurs, rubs, gallops or clicks. No pedal edema. Gastrointestinal system: Abdomen is nondistended, soft and nontender. No organomegaly or masses felt. Normal bowel sounds heard. Central nervous system: Alert and oriented. No focal neurological deficits. Extremities: Symmetric 5 x 5 power. Skin: Noted scleral icterus, subungual jaundice, generalized jaundice Psychiatry: Judgement and insight appear normal. Mood & affect appropriate.     Data Reviewed: I have personally reviewed following labs and imaging studies  CBC: Recent Labs  Lab 11/06/18 0825 11/07/18 0404 11/08/18  0507  WBC 20.2* 13.5* 14.8*  NEUTROABS 17.1*  --   --   HGB 16.2 13.8 12.7*  HCT 49.0 42.4 40.0  MCV 80.9 84.5 84.9  PLT 268 245 384   Basic Metabolic Panel: Recent Labs  Lab 11/06/18 0825  11/07/18 0404 11/08/18 0507  NA 131* 134* 132*  K 3.1* 3.2* 3.4*  CL 88* 93* 95*  CO2 _0 GLUCOSE 160* 109* 110*  BUN _1 CREATININE 0.86 0.96 0.79  CALCIUM 8.9 8.2* 8.1*   GFR: Estimated Creatinine Clearance: 152.2 mL/min (by C-G formula based on SCr of 0.79 mg/dL). Liver Function Tests: Recent Labs  Lab 11/06/18 0825 11/07/18 0404 11/08/18 0507  AST 97* 142* 173*  ALT 140* 143* 167*  ALKPHOS 430* 439* 579*  BILITOT 7.7* 8.4* 9.1*  PROT 7.1 5.7* 5.6*  ALBUMIN 3.0* 2.4* 2.3*   Recent Labs  Lab 11/06/18 0825  LIPASE 19   No results for input(s): AMMONIA in the last 168 hours. Coagulation Profile: Recent Labs  Lab 11/07/18 0404 11/08/18 0507  INR 1.9* 2.1*   Cardiac Enzymes: Recent Labs  Lab 11/06/18 1924  CKTOTAL 25*  CKMB 1.2   BNP (last 3 results) No results for input(s): PROBNP in the last 8760 hours. HbA1C: No results for input(s): HGBA1C in the last 72 hours. CBG: No results for input(s): GLUCAP in the last 168 hours. Lipid Profile: No results for input(s): CHOL, HDL, LDLCALC, TRIG, CHOLHDL, LDLDIRECT in the last 72 hours. Thyroid Function Tests: Recent Labs    11/06/18 1924 11/08/18 0507  TSH 0.335*  --   FREET4  --  1.41*   Anemia Panel: No results for input(s): VITAMINB12, FOLATE, FERRITIN, TIBC, IRON, RETICCTPCT in the last 72 hours. Sepsis Labs: No results for input(s): PROCALCITON, LATICACIDVEN in the last 168 hours.  Recent Results (from the past 240 hour(s))  SARS Coronavirus 2 [facility] order, Performed in [facility] hospital lab) Nasopharyngeal Nasopharyngeal Swab     Status: None   Collection Time: 11/06/18 11:18 AM   Specimen: Nasopharyngeal Swab  Result Value Ref Range Status   SARS Coronavirus 2 NEGATIVE NEGATIVE Final    Comment: (NOTE) If result is NEGATIVE SARS-CoV-2 target nucleic acids are NOT DETECTED. The SARS-CoV-2 RNA is generally detectable in upper and lower  respiratory specimens during the acute  phase of infection. The lowest  concentration of SARS-CoV-2 viral copies this assay can detect is 250  copies / mL. A negative result does not preclude SARS-CoV-2 infection  and should not be used as the sole basis for treatment or other  patient management decisions.  A negative result may occur with  improper specimen collection / handling, submission of specimen other  than nasopharyngeal swab, presence of viral mutation(s) within the  areas targeted by this assay, and inadequate number of viral copies  (<250 copies / mL). A negative result must be combined with clinical  observations, patient history, and epidemiological information. If result is POSITIVE SARS-CoV-2 target nucleic acids are DETECTED. The SARS-CoV-2 RNA is generally detectable in upper and lower  respiratory specimens dur ing the acute phase of infection.  Positive  results are indicative of active infection with SARS-CoV-2.  Clinical  correlation with patient history and other diagnostic information is  necessary to determine patient infection status.  Positive results do  not rule out bacterial infection or co-infection with other viruses. If result is PRESUMPTIVE POSTIVE SARS-CoV-2 nucleic acids MAY BE PRESENT.   A presumptive positive  result was obtained on the submitted specimen  and confirmed on repeat testing.  While 2019 novel coronavirus  (SARS-CoV-2) nucleic acids may be present in the submitted sample  additional confirmatory testing may be necessary for epidemiological  and / or clinical management purposes  to differentiate between  SARS-CoV-2 and other Sarbecovirus currently known to infect humans.  If clinically indicated additional testing with an alternate test  methodology [phone]) is advised. The SARS-CoV-2 RNA is generally  detectable in upper and lower respiratory sp ecimens during the acute  phase of infection. The expected result is Negative. Fact Sheet for Patients:   StrictlyIdeas.no Fact Sheet for Healthcare Providers: BankingDealers.co.za This test is not yet approved or cleared by the [place] FDA and has been authorized for detection and/or diagnosis of SARS-CoV-2 by FDA under an Emergency Use Authorization (EUA).  This EUA will remain in effect (meaning this test can be used) for the duration of the COVID-19 declaration under Section 564(b)(1) of the Act, 21 U.S.C. section 360bbb-3(b)(1), unless the authorization is terminated or revoked sooner. Performed at [facility], [address] [place], [place] [address]          Radiology Studies: Ct Angio Chest Pe W Or Wo Contrast  Result Date: 11/06/2018 CLINICAL DATA:  Cough and congestion for 7 days, on antibiotics, fever, dyspnea, PE suspected with positive D-dimer EXAM: CT ANGIOGRAPHY CHEST WITH CONTRAST TECHNIQUE: Multidetector CT imaging of the chest was performed using the standard protocol during bolus administration of intravenous contrast. Multiplanar CT image reconstructions and MIPs were obtained to evaluate the vascular anatomy. CONTRAST:  196m OMNIPAQUE IOHEXOL 350 MG/ML SOLN COMPARISON:  Chest radiograph 11/06/2018 FINDINGS: Cardiovascular: Satisfactory opacification of the pulmonary arteries to the segmental level. No evidence of pulmonary embolism. No central pulmonary artery enlargement. No CT evidence of right heart strain. Normal heart size. No pericardial effusion. The aorta is normal caliber. No luminal irregularity or periaortic stranding. Common origin of the brachiocephalic and left common carotid artery. Proximal great vessels and subclavian arteries are unremarkable. Mediastinum/Nodes: No enlarged mediastinal or axillary lymph nodes. Thyroid gland, trachea, and esophagus demonstrate no significant findings. Lungs/Pleura: Dependent atelectasis is noted posteriorly. More bandlike areas of atelectasis or scarring are  present in the bases. No consolidation, features of edema, pneumothorax, or effusion. No suspicious pulmonary nodules or masses. Upper Abdomen: No acute abnormalities present in the visualized portions of the upper abdomen. Musculoskeletal: No acute osseous abnormality or suspicious osseous lesion. Mild bilateral gynecomastia. No suspicious chest wall lesions. Review of the MIP images confirms the above findings. IMPRESSION: No evidence of pulmonary embolism. No acute intrathoracic process. Electronically Signed   By: PLovena LeM.D.   On: 11/06/2018 23:43   Nm Hepatobiliary Liver Func  Result Date: 11/08/2018 CLINICAL DATA:  Elevated bilirubin. Mild gallbladder wall thickening on ultrasound. EXAM: NUCLEAR MEDICINE HEPATOBILIARY IMAGING TECHNIQUE: Sequential images of the abdomen were obtained out to 60 minutes following intravenous administration of radiopharmaceutical. RADIOPHARMACEUTICALS:  7.5 mCi Tc-945mCholetec IV COMPARISON:  Ultrasound 11/06/2018 FINDINGS: Delayed clearance of radiotracer from the blood pool related to elevated bilirubin. There is uniform uptake within the liver. Imaging performed over 90 minutes with no transit of radiotracer into the bowel. Gallbladder filling cannot be assessed. IMPRESSION: Delayed/impaired transit of radiotracer from the liver into the bowel. Differential would include hepatic dysfunction (such as hepatitis) versus biliary obstruction. Consider MRCP for further evaluation. Gallbladder filling cannot be assessed with out a excretion of radiotracer into extrahepatic ducts.  Image performed on 11/07/2018 at 11 a.m. Images not presented for interpretation until 11/08/2018 at 9 a.m. Wellsite geologist difficulty. Findings conveyed to [place], [place] on 11/08/2018  at09:09. Electronically Signed   By: Suzy Bouchard M.D.   On: 11/08/2018 09:09        Scheduled Meds:  enoxaparin (LOVENOX) injection  40 mg Subcutaneous Q24H    morphine injection  3 mg  Intravenous Once   phytonadione  5 mg Subcutaneous Daily   Continuous Infusions:  sodium chloride Stopped (11/06/18 1205)   dextrose 5 % and 0.45% NaCl 100 mL/hr at 11/08/18 1416     LOS: 2 days   Time spent: 42 minutes spent on chart review, discussion with nursing staff, consultants, personally reviewing all imaging studies and labs, updating family and interview/physical exam; more than 50% of that time was spent in counseling and/or coordination of care.    Lummie Montijo J [place] DO [facility] Pager [phone]  If 7PM-7AM, please contact night-coverage [email or web address] Password [email or web address] 11/08/2018, 4:32 PM

## 2018-11-08 NOTE — Progress Notes (Addendum)
Patient ID: Gregory [name], male   DOB: 05-22-1990, 28 y.o.   MRN: [id number]    Progress Note   Subjective   Day # 2 CC: Fever chills cough nausea vomiting diarrhea and abnormal LFT"s  INR 2.1 T bili 9.1/alk phos/79/AST 173/ALT 67  WBC 14.8, platelets 237 CMV  IgM negative EBV pending Autoimmune serologies pending  Patient symptoms feeling a little bit better has been able to keep down some clear liquids.  No further nausea vomiting, diarrhea resolved Says his eyes are still red and irritated and bothering him, continues to cough.  No complaints of abdominal pain   Objective   Vital signs in last 24 hours: Temp:  [98.4 F (36.9 C)-99 F (37.2 C)] 98.4 F (36.9 C) (09/30 0449) Pulse Rate:  [106-109] 106 (09/30 0449) Resp:  [16-20] 18 (09/30 0449) BP: (104-120)/(77-81) 119/81 (09/30 0449) SpO2:  [94 %-100 %] 94 % (09/30 0449) Weight:  [82.8 kg] 82.8 kg (09/30 0518) Last BM Date: 11/06/18 General:    Young white male in NAD jaundiced, sclera are also erythematous Heart:  Regular rate and rhythm; no murmurs Lungs: Respirations even and unlabored, lungs CTA bilaterally Abdomen:  Soft, nontender and nondistended. Normal bowel sounds. Extremities:  Without edema. Neurologic:  Alert and oriented,  grossly normal neurologically. Psych:  Cooperative. Normal mood and affect.   Lab Results: Recent Labs    11/06/18 0825 11/07/18 0404 11/08/18 0507  WBC 20.2* 13.5* 14.8*  HGB 16.2 13.8 12.7*  HCT 49.0 42.4 40.0  PLT 268 245 237   BMET Recent Labs    11/06/18 0825 11/07/18 0404 11/08/18 0507  NA 131* 134* 132*  K 3.1* 3.2* 3.4*  CL 88* 93* 95*  CO2 _0 GLUCOSE 160* 109* 110*  BUN _1 CREATININE 0.86 0.96 0.79  CALCIUM 8.9 8.2* 8.1*   LFT Recent Labs    11/08/18 0507  PROT 5.6*  ALBUMIN 2.3*  AST 173*  ALT 167*  ALKPHOS 579*  BILITOT 9.1*   PT/INR Recent Labs    11/07/18 0404 11/08/18 0507  LABPROT 21.5* 23.5*  INR 1.9* 2.1*     Studies/Results:   Assessment / Plan:    #2 28 year old white male with acute illness onset 1 week ago with nausea vomiting, cough,fever, irritated eyes, coagulopathy, abnormal LFTs, hepatosplenomegaly on imaging.  Illness is consistent with an acute viral illness with hepatitis. Signs and symptoms are very consistent with COVID-19 infection though he has tested negative, would still consider  Findings on HIDA scan yesterday's exam is consistent with an acute hepatitis, do not feel that he needs further biliary imaging   Will discuss with Dr. Carlean Purl, consider repeat COVID-19 testing and EBV and CMV PCR's.  Advance diet as tolerated. Elevated INR is worrisome, follow closely and will add vitamin K   LOS: 2 days   Amy EsterwoodPA-C  11/08/2018, 11:14 AM     [facility] Attending   I have taken an interval history, reviewed the chart and examined the patient. I agree with the Advanced Practitioner's note, impression and recommendations.   He has been tested 2x and both negative for Covid so would not repeat given that info  Clinically better - labs worse  I will get the PCR tests for EBV and CMV - adenovirus is a consideration as a cause, will also check a ceruloplasmin  Abx off - appropriate   I spoke to patient and wife - together she was on phone  Glendell Docker  Simonne Maffucci, MD, [facility] 11/08/2018 12:21 PM Pager [phone]

## 2018-11-08 NOTE — Progress Notes (Signed)
Patient ID: Gregory [name], male   DOB: 10/12/1990, 28 y.o.   MRN: [id number]       Subjective: Feels a little better today.  No further N/V.  Tolerating clear liquids.  Just had a small solid BM for the first time in day.  No abdominal pain  Objective: Vital signs in last 24 hours: Temp:  [98.4 F (36.9 C)-99 F (37.2 C)] 98.4 F (36.9 C) (09/30 0449) Pulse Rate:  [106-109] 106 (09/30 0449) Resp:  [16-20] 18 (09/30 0449) BP: (104-120)/(77-81) 119/81 (09/30 0449) SpO2:  [94 %-100 %] 94 % (09/30 0449) Weight:  [82.8 kg] 82.8 kg (09/30 0518) Last BM Date: 11/06/18  Intake/Output from previous day: 09/29 0701 - 09/30 0700 In: 561.2 [P.O.:50; IV Piggyback:511.2] Out: -  Intake/Output this shift: No intake/output data recorded.  PE: Gen: NAD HEENT: sclera icteric Heart: regular Lungs: CTAB Abd: soft, NT, Nd, +BS  Lab Results:  Recent Labs    11/07/18 0404 11/08/18 0507  WBC 13.5* 14.8*  HGB 13.8 12.7*  HCT 42.4 40.0  PLT 245 237   BMET Recent Labs    11/07/18 0404 11/08/18 0507  NA 134* 132*  K 3.2* 3.4*  CL 93* 95*  CO2 27 27  GLUCOSE 109* 110*  BUN 15 11  CREATININE 0.96 0.79  CALCIUM 8.2* 8.1*   PT/INR Recent Labs    11/07/18 0404 11/08/18 0507  LABPROT 21.5* 23.5*  INR 1.9* 2.1*   CMP     Component Value Date/Time   NA 132 (L) 11/08/2018 0507   K 3.4 (L) 11/08/2018 0507   CL 95 (L) 11/08/2018 0507   CO2 27 11/08/2018 0507   GLUCOSE 110 (H) 11/08/2018 0507   BUN 11 11/08/2018 0507   CREATININE 0.79 11/08/2018 0507   CALCIUM 8.1 (L) 11/08/2018 0507   PROT 5.6 (L) 11/08/2018 0507   ALBUMIN 2.3 (L) 11/08/2018 0507   AST 173 (H) 11/08/2018 0507   ALT 167 (H) 11/08/2018 0507   ALKPHOS 579 (H) 11/08/2018 0507   BILITOT 9.1 (H) 11/08/2018 0507   GFRNONAA >60 11/08/2018 0507   GFRAA >60 11/08/2018 0507   Lipase     Component Value Date/Time   LIPASE 19 11/06/2018 0825       Studies/Results: Ct Angio Chest Pe W Or Wo Contrast   Result Date: 11/06/2018 CLINICAL DATA:  Cough and congestion for 7 days, on antibiotics, fever, dyspnea, PE suspected with positive D-dimer EXAM: CT ANGIOGRAPHY CHEST WITH CONTRAST TECHNIQUE: Multidetector CT imaging of the chest was performed using the standard protocol during bolus administration of intravenous contrast. Multiplanar CT image reconstructions and MIPs were obtained to evaluate the vascular anatomy. CONTRAST:  100mL OMNIPAQUE IOHEXOL 350 MG/ML SOLN COMPARISON:  Chest radiograph 11/06/2018 FINDINGS: Cardiovascular: Satisfactory opacification of the pulmonary arteries to the segmental level. No evidence of pulmonary embolism. No central pulmonary artery enlargement. No CT evidence of right heart strain. Normal heart size. No pericardial effusion. The aorta is normal caliber. No luminal irregularity or periaortic stranding. Common origin of the brachiocephalic and left common carotid artery. Proximal great vessels and subclavian arteries are unremarkable. Mediastinum/Nodes: No enlarged mediastinal or axillary lymph nodes. Thyroid gland, trachea, and esophagus demonstrate no significant findings. Lungs/Pleura: Dependent atelectasis is noted posteriorly. More bandlike areas of atelectasis or scarring are present in the bases. No consolidation, features of edema, pneumothorax, or effusion. No suspicious pulmonary nodules or masses. Upper Abdomen: No acute abnormalities present in the visualized portions of the upper abdomen. Musculoskeletal: No  acute osseous abnormality or suspicious osseous lesion. Mild bilateral gynecomastia. No suspicious chest wall lesions. Review of the MIP images confirms the above findings. IMPRESSION: No evidence of pulmonary embolism. No acute intrathoracic process. Electronically Signed   By: Lovena Le M.D.   On: 11/06/2018 23:43   US Abdomen Limited Ruq  Result Date: 11/06/2018 CLINICAL DATA:  Upper abdominal pain with nausea and vomiting. Fever. EXAM: ULTRASOUND ABDOMEN  LIMITED RIGHT UPPER QUADRANT COMPARISON:  None. FINDINGS: Gallbladder: No gallstones are evident. The gallbladder wall appears thickened and subtly edematous. No pericholecystic fluid evident. No sonographic Murphy sign noted by sonographer. Common bile duct: Diameter: 4 mm. No intrahepatic or extrahepatic biliary duct dilatation. Liver: No focal lesion identified. Liver echogenicity is overall increased. There is an area of suspected fatty sparing near the gallbladder fossa. Liver measures approximately 20 cm in length. Portal vein is patent on color Doppler imaging with normal direction of blood flow towards the liver. Other: Spleen measures 17.2 x 8.7 x 16.9 cm with a measured splenic volume of 1,325 cubic cm. No focal splenic lesions evident. IMPRESSION: 1. The gallbladder wall appears thickened and subtly edematous. No gallstones evident. Question a degree of acalculus cholecystitis. This finding may warrant nuclear medicine hepatobiliary imaging study to assess for cystic duct patency. 2. Liver enlargement. The overall liver echogenicity is increased. There is an area of decreased echogenicity near the gallbladder fossa, probably representing localized fatty sparing. Beyond apparent fatty sparing, no focal liver lesions evident. It should be noted that the sensitivity of ultrasound for detection of focal liver lesions is somewhat diminished in this circumstance. 3.  Splenomegaly.  No focal splenic lesions evident. Electronically Signed   By: Lowella Grip III M.D.   On: 11/06/2018 10:49    Anti-infectives: Anti-infectives (From admission, onward)   Start     Dose/Rate Route Frequency Ordered Stop   11/07/18 0600  vancomycin (VANCOCIN) 1,500 mg in sodium chloride 0.9 % 500 mL IVPB  Status:  Discontinued     1,500 mg 250 mL/hr over 120 Minutes Intravenous Every 12 hours 11/06/18 2009 11/07/18 0824   11/06/18 2000  piperacillin-tazobactam (ZOSYN) IVPB 3.375 g     3.375 g 12.5 mL/hr over 240 Minutes  Intravenous Every 8 hours 11/06/18 1919     11/06/18 1930  vancomycin (VANCOCIN) 1,500 mg in sodium chloride 0.9 % 500 mL IVPB     1,500 mg 250 mL/hr over 120 Minutes Intravenous  Once 11/06/18 1919 11/06/18 2300   11/06/18 1100  piperacillin-tazobactam (ZOSYN) IVPB 3.375 g     3.375 g 100 mL/hr over 30 Minutes Intravenous  Once 11/06/18 1057 11/06/18 1147       Assessment/Plan Elevated LFTs - TB up to 9.1 today.  This is unlikely to be related to his gallbladder as he does not have any stones.  This would more likely be from hepatic dysfunction Elevated INR - 2.1.  He is not on anticoagulation and expect this is also secondary to liver dysfunction. Elevated D-dimer - CTA negative for PE.  COVID negative x2.  Gallbladder wall thickening  -discussed HIDA with Dr. Leonia Reeves.  Essentially the liver does not excrete the dye and so this is consistent with hepatocellular dysfunction vs biliary obstruction (since his ducts can not be evaluated.)  However, his US shows no evidence of biliary dilatation and therefore, obstruction is highly unlikely.  He also has no evidence of gallstones which also makes obstruction unlikely. -would not plan to proceed with surgical intervention for gallbladder  as it appears his symptoms are not related to his gallbladder.  Will defer to GI and medicine for further work up of his liver dysfunction and etiology. -his diet may be advanced from our standpoint. -abx per primary service. -no plans for surgical follow up for gallbladder moving forward   FEN - CLD VTE - per medicine ID - [email or web address]   LOS: 2 days    Letha Cape , [facility] 11/08/2018, 9:02 AM Pager: [phone]

## 2018-11-09 ENCOUNTER — Other Ambulatory Visit: Payer: Self-pay | Admitting: Internal Medicine

## 2018-11-09 DIAGNOSIS — R945 Abnormal results of liver function studies: Secondary | ICD-10-CM

## 2018-11-09 DIAGNOSIS — B179 Acute viral hepatitis, unspecified: Secondary | ICD-10-CM

## 2018-11-09 DIAGNOSIS — R17 Unspecified jaundice: Secondary | ICD-10-CM

## 2018-11-09 LAB — COMPREHENSIVE METABOLIC PANEL
ALT: 203 U/L — ABNORMAL HIGH (ref 0–44)
AST: 198 U/L — ABNORMAL HIGH (ref 15–41)
Albumin: 2.1 g/dL — ABNORMAL LOW (ref 3.5–5.0)
Alkaline Phosphatase: 635 U/L — ABNORMAL HIGH (ref 38–126)
Anion gap: 9 (ref 5–15)
BUN: 8 mg/dL (ref 6–20)
CO2: 24 mmol/L (ref 22–32)
Calcium: 8 mg/dL — ABNORMAL LOW (ref 8.9–10.3)
Chloride: 98 mmol/L (ref 98–111)
Creatinine, Ser: 0.69 mg/dL (ref 0.61–1.24)
GFR calc Af Amer: >60 mL/min (ref >60)
GFR calc non Af Amer: >60 mL/min (ref >60)
Glucose, Bld: 129 mg/dL — ABNORMAL HIGH (ref 70–99)
Potassium: 4.2 mmol/L (ref 3.5–5.1)
Sodium: 131 mmol/L — ABNORMAL LOW (ref 135–145)
Total Bilirubin: 9.8 mg/dL — ABNORMAL HIGH (ref 0.3–1.2)
Total Protein: 5.6 g/dL — ABNORMAL LOW (ref 6.5–8.1)

## 2018-11-09 LAB — CBC
HCT: 41.3 % (ref 39.0–52.0)
Hemoglobin: 13 g/dL (ref 13.0–17.0)
MCH: 27 pg (ref 26.0–34.0)
MCHC: 31.5 g/dL (ref 30.0–36.0)
MCV: 85.7 fL (ref 80.0–100.0)
Platelets: 253 10*3/uL (ref 150–400)
RBC: 4.82 MIL/uL (ref 4.22–5.81)
RDW: 13.5 % (ref 11.5–15.5)
WBC: 18 10*3/uL — ABNORMAL HIGH (ref 4.0–10.5)
nRBC: 0 % (ref 0.0–0.2)

## 2018-11-09 LAB — PROCALCITONIN: Procalcitonin: 0.84 ng/mL

## 2018-11-09 LAB — PROTIME-INR
INR: 1.3 — ABNORMAL HIGH (ref 0.8–1.2)
Prothrombin Time: 16.4 seconds — ABNORMAL HIGH (ref 11.4–15.2)

## 2018-11-09 LAB — MAGNESIUM: Magnesium: 2 mg/dL (ref 1.7–2.4)

## 2018-11-09 MED ORDER — IBUPROFEN 200 MG PO TABS
400.0000 mg | ORAL_TABLET | Freq: Four times a day (QID) | ORAL | Status: DC | PRN
  Administered 2018-11-09: 400 mg via ORAL
  Filled 2018-11-09: qty 2

## 2018-11-09 NOTE — Consult Note (Signed)
[place] for Infectious Disease       Reason for Consult: acute hepatitis   Referring Physician: Dr. [place]  Principal Problem:   Sepsis [facility]) Active Problems:   Abnormal liver function   Leukocytosis   Acute lower UTI   Hypokalemia   Non-intractable vomiting   Splenomegaly   Fever, unspecified    enoxaparin (LOVENOX) injection  40 mg Subcutaneous Q24H    morphine injection  3 mg Intravenous Once   phytonadione  5 mg Subcutaneous Daily    Recommendations: Continue supportive care off of antibiotics Will check for acute HIV  If no other etiology identified, will list azithromycin as an allergy  Assessment: He developed viral-like symptoms with dry cough, fever of unknown cause, then started an empiric course of azithromycin and 2-3 days later developed n/v and poor po intake.  Also was taking Tylenol, cold medications.  No previous liver tests done prior to presenting to [facility].  I most suspect azithromycin as cause though this would be a diagnosis of exclusion and will wait for autoimmune labs.  Other work up unrevealing to date.  Certainly, along with CMV, EBV, acute HIV can lead to this.  I would not expect RMSF or other tick related causes without lymphopenia or thrombocytopenia.  Also, this is more c/w a cholestatic jaundice picture rather than a hepatitis picture.   Leukocytosis likely reactive vs from viral infection.     Antibiotics: None currently Previously received vancomycin and pieracillin-tazobactam  HPI: MENELIK MCFARREN is a 28 y.o. male with no significant past medical history who initially developed a non-productive cough, fever and was concerned with COVID.  He had felt like he was zoning out at work and went to [employer] where he could get a rapid test, which was negative.  He started to develop more fever and took a neighbors Z-pack.  About 2 days after starting the Z-pack, he started to get significant ill with nausea and vomiting, with  dry heaving.  He continued on the azithromycin for 4 days until last Sunday and presented to [facility] ED with symptoms and noted liver inflammation with high Alk Phos.  Since admission, his LFTs have increased a relatively small amount with a predominance of cholestatic jaundice.     Review of Systems:  Constitutional: positive for anorexia, though starting to eat more now or negative for fevers and chills Respiratory: negative for sputum or hemoptysis Gastrointestinal: negative for abdominal pain Integument/breast: negative for rash though did have a rash on his back initially though has largely resolved Hematologic/lymphatic: negative for lymphadenopathy Musculoskeletal: negative for myalgias and arthralgias Neurological: negative for headaches and dizziness Behavioral/Psych: negative for good mood All other systems reviewed and are negative    Past Medical History:  Diagnosis Date   ADHD (attention deficit hyperactivity disorder)    denies taking meds   Bipolar disorder ([place])    Pt reports that this runs in his family but he has never been officially diagnosed. Pt reports that his mom used to give him Seroquel as a child but "made me feel like a zombie so I wouldn't take it".   Headache(784.0)    Pneumonia     Social History   Tobacco Use   Smoking status: Never Smoker   Smokeless tobacco: Never Used  Substance Use Topics   Alcohol use: No   Drug use: No    Family History  Problem Relation Age of Onset   Diabetes Mother  Chiari malformation Mother     No Known Allergies  Physical Exam: Constitutional: in no apparent distress and alert  Vitals:   11/08/18 2145 11/09/18 0541  BP: 119/77 122/81  Pulse: 98 100  Resp: 20 20  Temp: 100.2 F (37.9 C) 98.4 F (36.9 C)  SpO2: 92% 97%   EYES: anicteric ENMT: no thrush Cardiovascular: Cor RRR Respiratory: CTA B; normal respiratory effort GI: Bowel sounds are normal, liver is not enlarged, spleen is  not enlarged Musculoskeletal: no pedal edema noted Skin: negatives: no rash, some tatoos Hematologic: no cervical lad  Lab Results  Component Value Date   WBC 18.0 (H) 11/09/2018   HGB 13.0 11/09/2018   HCT 41.3 11/09/2018   MCV 85.7 11/09/2018   PLT 253 11/09/2018    Lab Results  Component Value Date   CREATININE 0.69 11/09/2018   BUN 8 11/09/2018   NA 131 (L) 11/09/2018   K 4.2 11/09/2018   CL 98 11/09/2018   CO2 24 11/09/2018    Lab Results  Component Value Date   ALT 203 (H) 11/09/2018   AST 198 (H) 11/09/2018   ALKPHOS 635 (H) 11/09/2018     Microbiology: Recent Results (from the past 240 hour(s))  SARS Coronavirus 2 [facility] order, Performed in [facility] hospital lab) Nasopharyngeal Nasopharyngeal Swab     Status: None   Collection Time: 11/06/18 11:18 AM   Specimen: Nasopharyngeal Swab  Result Value Ref Range Status   SARS Coronavirus 2 NEGATIVE NEGATIVE Final    Comment: (NOTE) If result is NEGATIVE SARS-CoV-2 target nucleic acids are NOT DETECTED. The SARS-CoV-2 RNA is generally detectable in upper and lower  respiratory specimens during the acute phase of infection. The lowest  concentration of SARS-CoV-2 viral copies this assay can detect is 250  copies / mL. A negative result does not preclude SARS-CoV-2 infection  and should not be used as the sole basis for treatment or other  patient management decisions.  A negative result may occur with  improper specimen collection / handling, submission of specimen other  than nasopharyngeal swab, presence of viral mutation(s) within the  areas targeted by this assay, and inadequate number of viral copies  (<250 copies / mL). A negative result must be combined with clinical  observations, patient history, and epidemiological information. If result is POSITIVE SARS-CoV-2 target nucleic acids are DETECTED. The SARS-CoV-2 RNA is generally detectable in upper and lower  respiratory specimens dur ing the acute  phase of infection.  Positive  results are indicative of active infection with SARS-CoV-2.  Clinical  correlation with patient history and other diagnostic information is  necessary to determine patient infection status.  Positive results do  not rule out bacterial infection or co-infection with other viruses. If result is PRESUMPTIVE POSTIVE SARS-CoV-2 nucleic acids MAY BE PRESENT.   A presumptive positive result was obtained on the submitted specimen  and confirmed on repeat testing.  While 2019 novel coronavirus  (SARS-CoV-2) nucleic acids may be present in the submitted sample  additional confirmatory testing may be necessary for epidemiological  and / or clinical management purposes  to differentiate between  SARS-CoV-2 and other Sarbecovirus currently known to infect humans.  If clinically indicated additional testing with an alternate test  methodology [phone]) is advised. The SARS-CoV-2 RNA is generally  detectable in upper and lower respiratory sp ecimens during the acute  phase of infection. The expected result is Negative. Fact Sheet for Patients:  StrictlyIdeas.no Fact Sheet for Healthcare Providers: BankingDealers.co.za  This test is not yet approved or cleared by the [place] and has been authorized for detection and/or diagnosis of SARS-CoV-2 by FDA under an Emergency Use Authorization (EUA).  This EUA will remain in effect (meaning this test can be used) for the duration of the COVID-19 declaration under Section 564(b)(1) of the Act, 21 U.S.C. section 360bbb-3(b)(1), unless the authorization is terminated or revoked sooner. Performed at [facility], [address] [place], [place] [address]     Chaynce Schafer W [place], [place] for Infectious Disease [place] [email or web address] 11/09/2018, 10:15 AM

## 2018-11-09 NOTE — Progress Notes (Addendum)
Patient ID: Gregory [name], male   DOB: Dec 15, 1990, 28 y.o.   MRN: [id number]    Progress Note   Subjective   Day # 4 CC; fever/N/V/Cough,transaminitis/jaundice  Patient says he feels a bit better, has been tolerating solid food over the past 24 hours no nausea or vomiting.  He is worried as he appears more jaundiced.  Says his eyes are red and very irritated, continues to have a cough.  T-max 100.2 Repeat blood cultures pending  INR much better 1.3 today, receiving vitamin K T bili 9.8/alk phos 635/a LT 203/AST 198 WBC 18  Monospot negative EBV and CMV PCR's pending Adenovirus antibodies pending Mitochondrial antibody, ceruloplasmin pending ANA, anti-smooth muscle antibody negative    Objective   Vital signs in last 24 hours: Temp:  [98.4 F (36.9 C)-100.2 F (37.9 C)] 98.4 F (36.9 C) (10/01 0541) Pulse Rate:  [98-110] 100 (10/01 0541) Resp:  [20] 20 (10/01 0541) BP: (119-122)/(77-81) 122/81 (10/01 0541) SpO2:  [92 %-97 %] 97 % (10/01 0541) Last BM Date: 11/08/18 General:    Young white male in no acute distress, pleasant, jaundiced.  Sclera are red and jaundiced.   Heart:  Regular rate and rhythm; no murmurs Lungs: Respirations even and unlabored, lungs CTA bilaterally Abdomen:  Soft, nontender and nondistended. Normal bowel sounds. Extremities:  Without edema.  Jaundiced, no rash Neurologic:  Alert and oriented,  grossly normal neurologically. Psych:  Cooperative. Normal mood and affect.   Lab Results: Recent Labs    11/07/18 0404 11/08/18 0507 11/09/18 0425  WBC 13.5* 14.8* 18.0*  HGB 13.8 12.7* 13.0  HCT 42.4 40.0 41.3  PLT 245 237 253   BMET Recent Labs    11/07/18 0404 11/08/18 0507 11/09/18 0425  NA 134* 132* 131*  K 3.2* 3.4* 4.2  CL 93* 95* 98  CO2 27 27 24   GLUCOSE 109* 110* 129*  BUN 15 11 8   CREATININE 0.96 0.79 0.69  CALCIUM 8.2* 8.1* 8.0*   LFT Recent Labs    11/09/18 0425  PROT 5.6*  ALBUMIN 2.1*  AST 198*  ALT 203*   ALKPHOS 635*  BILITOT 9.8*   PT/INR Recent Labs    11/08/18 1822 11/09/18 0425  LABPROT 18.9* 16.4*  INR 1.6* 1.3*      Assessment / Plan:     #30 28 year old white male with acute illness onset 1 week ago, initially with nausea vomiting, diarrhea, fever, cough.  He has since developed progressive jaundice, transaminitis, coagulopathy, and hepatosplenomegaly on imaging. Over the past couple of days has had increase in scleritis type symptoms  Illness appears very consistent with COVID-19, however he tested negative x2 Initial test was rapid test through urgent care, and second test rapid test through ER. He certainly has an acute viral illness with associated hepatitis.  All markers thus far negative. Adenovirus antibodies, CMV and EBV PCR's pending Also ruling out any underlying autoimmune or inheritable liver disease as cause for hepatitis which would not explain several of his other symptoms.  Coagulopathy has significantly improved.  Continue vitamin K Continue supportive management Would consult ID today. Repeat COVID testing or other method of Covid testing , rule out rickettsial.?   LOS: 3 days   Amy EsterwoodPA-C  11/09/2018, 9:06 AM     [facility] Attending   I have taken an interval history, reviewed the chart and examined the patient. I agree with the Advanced Practitioner's note, impression and recommendations.   Though he did have increase in LFT  and T 100.2 he is much better overall.  I think Dr. Linus Salmons is probably right - Z pak as souce of a drug-induced liver injury.  I am thinking if he is same or better tomorrow (not labs necessarily) he can go home  Plan would be LFT's at our office 10/5 (ordered already and placed in f/u notes) and stay out of work til then at least.  Would then f/u by phone o the labs and plan GI office f/u.  Gatha Mayer, MD, [facility] 11/09/2018 4:24 PM Pager [phone]

## 2018-11-09 NOTE — Progress Notes (Signed)
PROGRESS NOTE    Gregory [name]  MOQ:[id number] DOB: 11-20-1990 DOA: 11/06/2018 PCP: Patient, No Pcp Per   Brief Narrative:   Gregory [name] is a 28 year old male with past medical history of Chari malformation, ADD, bipolar disorder scented to the ED with fever, chills, cough associated with nausea, vomiting and diarrhea since last Thursday.  He also endorsed some sinus pressure.  He was tested for COVID-19 on Thursday which was negative.  He had a hold of Z-Pak and started a 5-day course without much effect.  He also was taking several doses of Tylenol every 4-6 hours for 4 to 5 days, and DayQuil/NyQuil.  Patient currently works in an [employer], denies any known sick contacts.  Only occupational exposures are occasional urethane and primers.  His sister has reported Crohn's disease.  Previously endorsed cocaine use, but has been clean for 7 years.  Also denies any significant alcohol use, only reports drinking once yearly on his birthday.  In the ED, his temperature was noted to be 98.6, heart rate 120, RR 18, BP 134/106, Pox 100% on room air.  Labs significant for elevated AST 97, ALT 140, total bilirubin 7.7, alkaline phosphatase 430.  WBC count of 20.2.  Dimer 2.34, TSH 0.335.  Urinalysis with moderate bilirubin, 20 ketones, negative nitrite/leukoesterase, rare bacteria, 11-20 WBCs.  Chest x-ray without active disease process.  Right upper quadrant ultrasound with gallbladder wall thickening and slightly edematous, no gallstones evident.  Liver enlargement and splenomegaly.  EDP consulted general surgery for evaluation of gallbladder abnormalities on imaging and requested hospitalist admission for further evaluation and treatment for transaminitis with hyperbilirubinemia.  Assessment & Plan:   Principal Problem:   Sepsis ([facility] [facility]) Active Problems:   Abnormal liver function   Leukocytosis   Acute lower UTI   Hypokalemia   Non-intractable vomiting   Splenomegaly   Fever,  unspecified   Sepsis, present on admission UTI versus acalculous cholecystitis Patient presenting with fever, chills.  Noted to have elevated WBC count of 20 K.  Urinalysis with negative nitrite/leukoesterase but with 11-20 WBCs.  Right upper quadrant ultrasound noticeable for gallbladder wall thickening/edematous, increased liver echogenicity concerning for a calculus cholecystitis.  General surgery was consulted and recommended HIDA scan.  HIDA scan was notable for delayed/impaired transit of radiotracer from the liver into the bowel with differential including hepatic dysfunction versus biliary obstruction.  General surgery does not believe this is related to gallbladder pathology and recommends gastroenterology input. --Gastroenterology and Infectious disease following, appreciate assistance --D/C'd Zosyn on 9/30; as no clear identifiable infectious source --Blood cultures x2 repeated on 11/09/2018 --Continue to monitor fever curve, trend CBC  Transaminitis Hepatitis, unclear cause Hyperbilirubinemia Coagulopathy Ultrasound right upper quadrant notable for increased echogenicity of liver with splenomegaly.  Initially concern for possible acalculous cholecystitis as cause, but seems to be less viable with HIDA scan results.  ESR elevated at 51. CMV less than 30, within normal limits.  ANA negative.  IgG 876 within normal limits.  Mono negative.  Acute hepatitis panel negative. HIV non-reactive.  Tylenol level within normal limits.  Infectious disease thinks it may be related to antibiotic use with azithromycin. --AST 97, 142, 177, 198 --ALT 140, 143, 167, 203 --AP 430, 439, 579, 635 --Tbili 7.7, 8.4, 9.1, 9.8 --AsMA 9, normal --EBV PCR IgM <36.0, normal --CMV quantitative PCR pending --Antimitochondrial antibodies: Pending --adenovirus antibodies: Pending --Ceruloplasmin: Pending --HIV RNA quant: pending --Continue to trend LFTs, INR  Hypokalemia Repleted. --Continue to monitor  electrolytes to include  magnesium daily.  Low TSH TSH 0.335, free T4 elevated at 1.41.  Unclear significance. --Recommend repeat TFTs in 4-6 weeks.   DVT prophylaxis: Lovenox Code Status: Full code Family Communication: None Disposition Plan: Continue inpatient, anticipate discharge home when medically ready   Consultants:   General surgery: Signed off 11/08/2018  [facility]  Infectious disease   Procedures:  Transthoracic echocardiogram 11/07/2018: IMPRESSIONS    1. Left ventricular ejection fraction, by visual estimation, is 50 to 55%. The left ventricle has normal function. Normal left ventricular size. There is no left ventricular hypertrophy.  2. Global right ventricle has normal systolic function.The right ventricular size is normal.  3. Left atrial size was normal.  4. Right atrial size was normal.  5. The mitral valve is normal in structure. Trace mitral valve regurgitation. No evidence of mitral stenosis.  6. The tricuspid valve is normal in structure. Tricuspid valve regurgitation is trivial.  7. The aortic valve was not well visualized Aortic valve regurgitation was not visualized by color flow Doppler. Structurally normal aortic valve, with no evidence of sclerosis or stenosis.  8. The pulmonic valve was normal in structure. Pulmonic valve regurgitation is not visualized by color flow Doppler.  9. The inferior vena cava is normal in size with greater than 50% respiratory variability, suggesting right atrial pressure of 3 mmHg. 10. Low normal LV systolic function; no significant valvular heart disease.  Antimicrobials:  Zosyn 9/28 - 9/30   Subjective: Patient seen and examined at bedside, resting comfortably.  Tolerating diet.  Nausea/vomiting have resolved.  Although continues with increasing liver enzymes/T bilirubin.  Overnight with mild fever of 100.2, WBC count now up to 18.0.  Consulted infectious disease for further evaluation and recommendations.  No  other significant complaints or concerns at this time.  Denies headache, no fever/chills/night sweats, no nausea/vomiting/diarrhea, no chest pain, no palpitations, no shortness of breath, no weakness, no fatigue, no paresthesias.  No acute events overnight per nursing staff.  Objective: Vitals:   11/08/18 1533 11/08/18 2145 11/09/18 0541 11/09/18 1334  BP: 122/79 119/77 122/81 129/85  Pulse: (!) 110 98 100 93  Resp: _0 Temp: 99.9 F (37.7 C) 100.2 F (37.9 C) 98.4 F (36.9 C) 99.1 F (37.3 C)  TempSrc: Oral Oral Oral Oral  SpO2: 93% 92% 97% 98%  Weight:      Height:        Intake/Output Summary (Last 24 hours) at 11/09/2018 1336 Last data filed at 11/09/2018 0214 Gross per 24 hour  Intake 1215.19 ml  Output --  Net 1215.19 ml   Filed Weights   11/06/18 1756 11/07/18 0500 11/08/18 0518  Weight: 79.7 kg 79.2 kg 82.8 kg    Examination:  General exam: Appears calm and comfortable  Respiratory system: Clear to auscultation. Respiratory effort normal. Cardiovascular system: S1 & S2 heard, RRR. No JVD, murmurs, rubs, gallops or clicks. No pedal edema. Gastrointestinal system: Abdomen is nondistended, soft and nontender. No organomegaly or masses felt. Normal bowel sounds heard. Central nervous system: Alert and oriented. No focal neurological deficits. Extremities: Symmetric 5 x 5 power. Skin: Noted scleral icterus, subungual jaundice, generalized jaundice Psychiatry: Judgement and insight appear normal. Mood & affect appropriate.     Data Reviewed: I have personally reviewed following labs and imaging studies  CBC: Recent Labs  Lab 11/06/18 0825 11/07/18 0404 11/08/18 0507 11/09/18 0425  WBC 20.2* 13.5* 14.8* 18.0*  NEUTROABS 17.1*  --   --   --  HGB 16.2 13.8 12.7* 13.0  HCT 49.0 42.4 40.0 41.3  MCV 80.9 84.5 84.9 85.7  PLT 268 245 237 287   Basic Metabolic Panel: Recent Labs  Lab 11/06/18 0825 11/07/18 0404 11/08/18 0507 11/09/18 0425  NA 131*  134* 132* 131*  K 3.1* 3.2* 3.4* 4.2  CL 88* 93* 95* 98  CO2 _0 GLUCOSE 160* 109* 110* 129*  BUN _1 CREATININE 0.86 0.96 0.79 0.69  CALCIUM 8.9 8.2* 8.1* 8.0*  MG  --   --   --  2.0   GFR: Estimated Creatinine Clearance: 152.2 mL/min (by C-G formula based on SCr of 0.69 mg/dL). Liver Function Tests: Recent Labs  Lab 11/06/18 0825 11/07/18 0404 11/08/18 0507 11/09/18 0425  AST 97* 142* 173* 198*  ALT 140* 143* 167* 203*  ALKPHOS 430* 439* 579* 635*  BILITOT 7.7* 8.4* 9.1* 9.8*  PROT 7.1 5.7* 5.6* 5.6*  ALBUMIN 3.0* 2.4* 2.3* 2.1*   Recent Labs  Lab 11/06/18 0825  LIPASE 19   No results for input(s): AMMONIA in the last 168 hours. Coagulation Profile: Recent Labs  Lab 11/07/18 0404 11/08/18 0507 11/08/18 1822 11/09/18 0425  INR 1.9* 2.1* 1.6* 1.3*   Cardiac Enzymes: Recent Labs  Lab 11/06/18 1924  CKTOTAL 25*  CKMB 1.2   BNP (last 3 results) No results for input(s): PROBNP in the last 8760 hours. HbA1C: No results for input(s): HGBA1C in the last 72 hours. CBG: No results for input(s): GLUCAP in the last 168 hours. Lipid Profile: No results for input(s): CHOL, HDL, LDLCALC, TRIG, CHOLHDL, LDLDIRECT in the last 72 hours. Thyroid Function Tests: Recent Labs    11/06/18 1924 11/08/18 0507  TSH 0.335*  --   FREET4  --  1.41*   Anemia Panel: No results for input(s): VITAMINB12, FOLATE, FERRITIN, TIBC, IRON, RETICCTPCT in the last 72 hours. Sepsis Labs: Recent Labs  Lab 11/09/18 0425  PROCALCITON 0.84    Recent Results (from the past 240 hour(s))  SARS Coronavirus 2 [facility] order, Performed in [facility] hospital lab) Nasopharyngeal Nasopharyngeal Swab     Status: None   Collection Time: 11/06/18 11:18 AM   Specimen: Nasopharyngeal Swab  Result Value Ref Range Status   SARS Coronavirus 2 NEGATIVE NEGATIVE Final    Comment: (NOTE) If result is NEGATIVE SARS-CoV-2 target nucleic acids are NOT DETECTED. The SARS-CoV-2 RNA is  generally detectable in upper and lower  respiratory specimens during the acute phase of infection. The lowest  concentration of SARS-CoV-2 viral copies this assay can detect is 250  copies / mL. A negative result does not preclude SARS-CoV-2 infection  and should not be used as the sole basis for treatment or other  patient management decisions.  A negative result may occur with  improper specimen collection / handling, submission of specimen other  than nasopharyngeal swab, presence of viral mutation(s) within the  areas targeted by this assay, and inadequate number of viral copies  (<250 copies / mL). A negative result must be combined with clinical  observations, patient history, and epidemiological information. If result is POSITIVE SARS-CoV-2 target nucleic acids are DETECTED. The SARS-CoV-2 RNA is generally detectable in upper and lower  respiratory specimens dur ing the acute phase of infection.  Positive  results are indicative of active infection with SARS-CoV-2.  Clinical  correlation with patient history and other diagnostic information is  necessary to determine patient infection status.  Positive results do  not rule  out bacterial infection or co-infection with other viruses. If result is PRESUMPTIVE POSTIVE SARS-CoV-2 nucleic acids MAY BE PRESENT.   A presumptive positive result was obtained on the submitted specimen  and confirmed on repeat testing.  While 2019 novel coronavirus  (SARS-CoV-2) nucleic acids may be present in the submitted sample  additional confirmatory testing may be necessary for epidemiological  and / or clinical management purposes  to differentiate between  SARS-CoV-2 and other Sarbecovirus currently known to infect humans.  If clinically indicated additional testing with an alternate test  methodology [phone]) is advised. The SARS-CoV-2 RNA is generally  detectable in upper and lower respiratory sp ecimens during the acute  phase of  infection. The expected result is Negative. Fact Sheet for Patients:  StrictlyIdeas.no Fact Sheet for Healthcare Providers: BankingDealers.co.za This test is not yet approved or cleared by the [place] FDA and has been authorized for detection and/or diagnosis of SARS-CoV-2 by FDA under an Emergency Use Authorization (EUA).  This EUA will remain in effect (meaning this test can be used) for the duration of the COVID-19 declaration under Section 564(b)(1) of the Act, 21 U.S.C. section 360bbb-3(b)(1), unless the authorization is terminated or revoked sooner. Performed at [facility], [address] [place], [place] [address]          Radiology Studies: Nm Hepatobiliary Liver Func  Result Date: 11/08/2018 CLINICAL DATA:  Elevated bilirubin. Mild gallbladder wall thickening on ultrasound. EXAM: NUCLEAR MEDICINE HEPATOBILIARY IMAGING TECHNIQUE: Sequential images of the abdomen were obtained out to 60 minutes following intravenous administration of radiopharmaceutical. RADIOPHARMACEUTICALS:  7.5 mCi Tc-32m Choletec IV COMPARISON:  Ultrasound 11/06/2018 FINDINGS: Delayed clearance of radiotracer from the blood pool related to elevated bilirubin. There is uniform uptake within the liver. Imaging performed over 90 minutes with no transit of radiotracer into the bowel. Gallbladder filling cannot be assessed. IMPRESSION: Delayed/impaired transit of radiotracer from the liver into the bowel. Differential would include hepatic dysfunction (such as hepatitis) versus biliary obstruction. Consider MRCP for further evaluation. Gallbladder filling cannot be assessed with out a excretion of radiotracer into extrahepatic ducts. Image performed on 11/07/2018 at 11 a.m. Images not presented for interpretation until 11/08/2018 at 9 a.m. CWellsite geologistdifficulty. Findings conveyed to [place] PUtahon 11/08/2018  at09:09. Electronically  Signed   By: SSuzy BouchardM.D.   On: 11/08/2018 09:09        Scheduled Meds:  enoxaparin (LOVENOX) injection  40 mg Subcutaneous Q24H    morphine injection  3 mg Intravenous Once   phytonadione  5 mg Subcutaneous Daily   Continuous Infusions:  sodium chloride Stopped (11/06/18 1205)   dextrose 5 % and 0.45% NaCl 100 mL/hr at 11/09/18 1136     LOS: 3 days   Time spent: 36 minutes spent on chart review, discussion with nursing staff, consultants, personally reviewing all imaging studies and labs, updating family and interview/physical exam; more than 50% of that time was spent in counseling and/or coordination of care.    Saron Vanorman J [place] DO [facility] Pager [phone] If 7PM-7AM, please contact night-coverage [email or web address] Password [email or web address] 11/09/2018, 1:36 PM

## 2018-11-10 ENCOUNTER — Other Ambulatory Visit: Payer: Self-pay

## 2018-11-10 DIAGNOSIS — Z881 Allergy status to other antibiotic agents status: Secondary | ICD-10-CM

## 2018-11-10 DIAGNOSIS — R945 Abnormal results of liver function studies: Secondary | ICD-10-CM

## 2018-11-10 DIAGNOSIS — J069 Acute upper respiratory infection, unspecified: Secondary | ICD-10-CM

## 2018-11-10 DIAGNOSIS — K719 Toxic liver disease, unspecified: Secondary | ICD-10-CM

## 2018-11-10 LAB — COMPREHENSIVE METABOLIC PANEL
ALT: 227 U/L — ABNORMAL HIGH (ref 0–44)
AST: 184 U/L — ABNORMAL HIGH (ref 15–41)
Albumin: 2 g/dL — ABNORMAL LOW (ref 3.5–5.0)
Alkaline Phosphatase: 682 U/L — ABNORMAL HIGH (ref 38–126)
Anion gap: 10 (ref 5–15)
BUN: 9 mg/dL (ref 6–20)
CO2: 23 mmol/L (ref 22–32)
Calcium: 8.1 mg/dL — ABNORMAL LOW (ref 8.9–10.3)
Chloride: 101 mmol/L (ref 98–111)
Creatinine, Ser: 0.57 mg/dL — ABNORMAL LOW (ref 0.61–1.24)
GFR calc Af Amer: >60 mL/min (ref >60)
GFR calc non Af Amer: >60 mL/min (ref >60)
Glucose, Bld: 119 mg/dL — ABNORMAL HIGH (ref 70–99)
Potassium: 4 mmol/L (ref 3.5–5.1)
Sodium: 134 mmol/L — ABNORMAL LOW (ref 135–145)
Total Bilirubin: 10.4 mg/dL — ABNORMAL HIGH (ref 0.3–1.2)
Total Protein: 5.5 g/dL — ABNORMAL LOW (ref 6.5–8.1)

## 2018-11-10 LAB — PROTIME-INR
INR: 1.2 (ref 0.8–1.2)
Prothrombin Time: 14.8 seconds (ref 11.4–15.2)

## 2018-11-10 LAB — CERULOPLASMIN: Ceruloplasmin: 31.5 mg/dL — ABNORMAL HIGH (ref 16.0–31.0)

## 2018-11-10 LAB — CMV DNA, QUANTITATIVE, PCR
CMV DNA Quant: POSITIVE IU/mL
Log10 CMV Qn DNA Pl: UNDETERMINED log10 IU/mL

## 2018-11-10 LAB — CBC
HCT: 40.6 % (ref 39.0–52.0)
Hemoglobin: 13 g/dL (ref 13.0–17.0)
MCH: 27 pg (ref 26.0–34.0)
MCHC: 32 g/dL (ref 30.0–36.0)
MCV: 84.2 fL (ref 80.0–100.0)
Platelets: 243 10*3/uL (ref 150–400)
RBC: 4.82 MIL/uL (ref 4.22–5.81)
RDW: 13.7 % (ref 11.5–15.5)
WBC: 16.9 10*3/uL — ABNORMAL HIGH (ref 4.0–10.5)
nRBC: 0 % (ref 0.0–0.2)

## 2018-11-10 LAB — PROCALCITONIN: Procalcitonin: 0.63 ng/mL

## 2018-11-10 LAB — ADENOVIRUS ANTIBODIES: Adenovirus Antibody: 1:16 {titer} — ABNORMAL HIGH

## 2018-11-10 NOTE — Discharge Summary (Signed)
Physician Discharge Summary  Gregory [name]:[id number] DOB: 1990/02/18 DOA: 11/06/2018  PCP: Patient, No Pcp Per  Admit date: 11/06/2018 Discharge date: 11/10/2018  Admitted From: Home Disposition: Home  Recommendations for Outpatient Follow-up:  1. Follow up with PCP in 1 week 2. Follow-up with [facility] gastroenterology as scheduled 3. Please obtain CMP, INR on 11/13/2018 as scheduled in the GI office   Home Health: No Equipment/Devices: None  Discharge Condition: Stable CODE STATUS: Full code Diet recommendation: Regular diet  History of present illness:  Gregory [name] is a 28 year old male with past medical history of Chari malformation, ADD, bipolar disorder scented to the ED with fever, chills, cough associated with nausea, vomiting and diarrhea since last Thursday.  He also endorsed some sinus pressure.  He was tested for COVID-19 on Thursday which was negative.  He had a hold of Z-Pak and started a 5-day course without much effect.  He also was taking several doses of Tylenol every 4-6 hours for 4 to 5 days, and DayQuil/NyQuil.  Patient currently works in an [employer], denies any known sick contacts.  Only occupational exposures are occasional urethane and primers.  His sister has reported Crohn's disease.  Previously endorsed cocaine use, but has been clean for 7 years.  Also denies any significant alcohol use, only reports drinking once yearly on his birthday.  In the ED, his temperature was noted to be 98.6, heart rate 120, RR 18, BP 134/106, Pox 100% on room air.  Labs significant for elevated AST 97, ALT 140, total bilirubin 7.7, alkaline phosphatase 430. WBC count of 20.2.  Dimer 2.34, TSH 0.335.  Urinalysis with moderate bilirubin, 20 ketones, negative nitrite/leukoesterase, rare bacteria, 11-20 WBCs.  Chest x-ray without active disease process.  Right upper quadrant ultrasound with gallbladder wall thickening and slightly edematous, no gallstones evident.   Liver enlargement and splenomegaly.  EDP consulted general surgery for evaluation of gallbladder abnormalities on imaging and requested hospitalist admission for further evaluation and treatment for transaminitis with hyperbilirubinemia.  Hospital course:  Sepsis, present on admission: Ruled out UTI: Ruled out with negative urine culture Questionable acalculous cholecystitis Patient presenting with fever, chills.  Noted to have elevated WBC count of 20 K.  Urinalysis with negative nitrite/leukoesterase but with 11-20 WBCs, urine culture with no growth. Right upper quadrant ultrasound noticeable for gallbladder wall thickening/edematous, increased liver echogenicity concerning for a calculus cholecystitis.  General surgery was consulted and recommended HIDA scan. HIDA scan was notable for delayed/impaired transit of radiotracer from the liver into the bowel with differential including hepatic dysfunction versus biliary obstruction.  General surgery does not believe this is related to gallbladder pathology and recommends gastroenterology input.  Gastroenterology and infectious disease were consulted.  Unclear that this is actually a true infection and ultimately his antibiotics were discontinued on 11/08/2018 for concerns of drug-induced liver injury.  His blood culture remained negative during hospitalization.  Cholestatic jaundice/liver injury Suspected drug-induced liver injury 2/2 azithromycin Hyperbilirubinemia Coagulopathy Ultrasound right upper quadrant notable for increased echogenicity of liver with splenomegaly.  Initially concern for possible acalculous cholecystitis as cause, but seems to be less viable with HIDA scan results.  ESR elevated at 51. CMV less than 30, within normal limits.  ANA negative.  IgG 876 within normal limits.  Mono negative.  Acute hepatitis panel negative. HIV non-reactive.  Tylenol level within normal limits. EBV PCR IgM less than 36.0, within normal limits.  AsMA  normal.  CMV quantitative PCR, HIV RNA quantitative, ceruloplasmin, adenovirus antibodies,  Epstein-Barr virus, CMV DNA, antimitochondrial antibodies pending at time of discharge.  AST 198, ALT 203, alkaline phosphatase 635, total bilirubin 9.8, INR 1.2 at time of discharge.  Patient has scheduled lab appointment at [facility] on Monday, 11/13/2018 for repeat CMP, CBC and INR.  Instructed to avoid hepatotoxins such as Tylenol.  Infectious disease thinks it may be related to antibiotic use with azithromycin; which is now listed as an allergy.  Low TSH TSH 0.335, free T4 elevated at 1.41.  Unclear significance. Recommend repeat TFTs in 4-6 weeks.  Discharge Diagnoses:  Principal Problem:   Sepsis ([place]) Active Problems:   Abnormal liver function   Leukocytosis   Acute lower UTI   Hypokalemia   Non-intractable vomiting   Splenomegaly   Fever, unspecified   Jaundice    Discharge Instructions  Discharge Instructions    Call MD for:  difficulty breathing, headache or visual disturbances   Complete by: As directed    Call MD for:  extreme fatigue   Complete by: As directed    Call MD for:  persistant dizziness or light-headedness   Complete by: As directed    Call MD for:  persistant nausea and vomiting   Complete by: As directed    Call MD for:  severe uncontrolled pain   Complete by: As directed    Call MD for:  temperature >100.4   Complete by: As directed    Diet - low sodium heart healthy   Complete by: As directed    Increase activity slowly   Complete by: As directed      Allergies as of 11/10/2018      Reactions   Azithromycin Other (See Comments)   Drug-induced hepatitis      Medication List    STOP taking these medications   cyclobenzaprine 10 MG tablet Commonly known as: FLEXERIL   meloxicam 7.5 MG tablet Commonly known as: Mobic      Follow-up Information    [facility] Follow up on 11/13/2018.   Specialty: Gastroenterology Why: Go to  lab in basement and have bloodwork done and results will be called Contact information: [place] [address] [phone]         Allergies  Allergen Reactions  . Azithromycin Other (See Comments)    Drug-induced hepatitis    Consultations:  [facility]  General surgery  Infectious disease   Procedures/Studies: Ct Angio Chest Pe W Or Wo Contrast  Result Date: 11/06/2018 CLINICAL DATA:  Cough and congestion for 7 days, on antibiotics, fever, dyspnea, PE suspected with positive D-dimer EXAM: CT ANGIOGRAPHY CHEST WITH CONTRAST TECHNIQUE: Multidetector CT imaging of the chest was performed using the standard protocol during bolus administration of intravenous contrast. Multiplanar CT image reconstructions and MIPs were obtained to evaluate the vascular anatomy. CONTRAST:  157m OMNIPAQUE IOHEXOL 350 MG/ML SOLN COMPARISON:  Chest radiograph 11/06/2018 FINDINGS: Cardiovascular: Satisfactory opacification of the pulmonary arteries to the segmental level. No evidence of pulmonary embolism. No central pulmonary artery enlargement. No CT evidence of right heart strain. Normal heart size. No pericardial effusion. The aorta is normal caliber. No luminal irregularity or periaortic stranding. Common origin of the brachiocephalic and left common carotid artery. Proximal great vessels and subclavian arteries are unremarkable. Mediastinum/Nodes: No enlarged mediastinal or axillary lymph nodes. Thyroid gland, trachea, and esophagus demonstrate no significant findings. Lungs/Pleura: Dependent atelectasis is noted posteriorly. More bandlike areas of atelectasis or scarring are present in the bases. No consolidation, features of edema,  pneumothorax, or effusion. No suspicious pulmonary nodules or masses. Upper Abdomen: No acute abnormalities present in the visualized portions of the upper abdomen. Musculoskeletal: No acute osseous abnormality or suspicious  osseous lesion. Mild bilateral gynecomastia. No suspicious chest wall lesions. Review of the MIP images confirms the above findings. IMPRESSION: No evidence of pulmonary embolism. No acute intrathoracic process. Electronically Signed   By: Lovena Le M.D.   On: 11/06/2018 23:43   Nm Hepatobiliary Liver Func  Result Date: 11/08/2018 CLINICAL DATA:  Elevated bilirubin. Mild gallbladder wall thickening on ultrasound. EXAM: NUCLEAR MEDICINE HEPATOBILIARY IMAGING TECHNIQUE: Sequential images of the abdomen were obtained out to 60 minutes following intravenous administration of radiopharmaceutical. RADIOPHARMACEUTICALS:  7.5 mCi Tc-84m Choletec IV COMPARISON:  Ultrasound 11/06/2018 FINDINGS: Delayed clearance of radiotracer from the blood pool related to elevated bilirubin. There is uniform uptake within the liver. Imaging performed over 90 minutes with no transit of radiotracer into the bowel. Gallbladder filling cannot be assessed. IMPRESSION: Delayed/impaired transit of radiotracer from the liver into the bowel. Differential would include hepatic dysfunction (such as hepatitis) versus biliary obstruction. Consider MRCP for further evaluation. Gallbladder filling cannot be assessed with out a excretion of radiotracer into extrahepatic ducts. Image performed on 11/07/2018 at 11 a.m. Images not presented for interpretation until 11/08/2018 at 9 a.m. CWellsite geologistdifficulty. Findings conveyed to OPagedale PUtahon 11/08/2018  at09:09. Electronically Signed   By: SSuzy BouchardM.D.   On: 11/08/2018 09:09   Dg Chest Portable 1 View  Result Date: 11/06/2018 CLINICAL DATA:  Cough and fever EXAM: PORTABLE CHEST 1 VIEW COMPARISON:  None. FINDINGS: The heart size and mediastinal contours are within normal limits. Both lungs are clear. The visualized skeletal structures are unremarkable. IMPRESSION: No active disease. Electronically Signed   By: MInez CatalinaM.D.   On: 11/06/2018 08:50   UKoreaAbdomen  Limited Ruq  Result Date: 11/06/2018 CLINICAL DATA:  Upper abdominal pain with nausea and vomiting. Fever. EXAM: ULTRASOUND ABDOMEN LIMITED RIGHT UPPER QUADRANT COMPARISON:  None. FINDINGS: Gallbladder: No gallstones are evident. The gallbladder wall appears thickened and subtly edematous. No pericholecystic fluid evident. No sonographic Murphy sign noted by sonographer. Common bile duct: Diameter: 4 mm. No intrahepatic or extrahepatic biliary duct dilatation. Liver: No focal lesion identified. Liver echogenicity is overall increased. There is an area of suspected fatty sparing near the gallbladder fossa. Liver measures approximately 20 cm in length. Portal vein is patent on color Doppler imaging with normal direction of blood flow towards the liver. Other: Spleen measures 17.2 x 8.7 x 16.9 cm with a measured splenic volume of 1,325 cubic cm. No focal splenic lesions evident. IMPRESSION: 1. The gallbladder wall appears thickened and subtly edematous. No gallstones evident. Question a degree of acalculus cholecystitis. This finding may warrant nuclear medicine hepatobiliary imaging study to assess for cystic duct patency. 2. Liver enlargement. The overall liver echogenicity is increased. There is an area of decreased echogenicity near the gallbladder fossa, probably representing localized fatty sparing. Beyond apparent fatty sparing, no focal liver lesions evident. It should be noted that the sensitivity of ultrasound for detection of focal liver lesions is somewhat diminished in this circumstance. 3.  Splenomegaly.  No focal splenic lesions evident. Electronically Signed   By: WLowella GripIII M.D.   On: 11/06/2018 10:49     Subjective:   Discharge Exam: Vitals:   11/10/18 0525 11/10/18 1214  BP: 127/81 124/87  Pulse: (!) 101 98  Resp: 16 20  Temp: 98.9  F (37.2 C) 100.2 F (37.9 C)  SpO2: 98% 97%   Vitals:   11/09/18 1704 11/09/18 2213 11/10/18 0525 11/10/18 1214  BP:  (!) 132/91 127/81  124/87  Pulse:  85 (!) 101 98  Resp:  _0 Temp: 98.8 F (37.1 C) 97.9 F (36.6 C) 98.9 F (37.2 C) 100.2 F (37.9 C)  TempSrc: Oral Oral Oral Oral  SpO2:  98% 98% 97%  Weight:   79.8 kg   Height:        General: Pt is alert, awake, not in acute distress, notable scleral icterus and jaundice Cardiovascular: RRR, S1/S2 +, no rubs, no gallops Respiratory: CTA bilaterally, no wheezing, no rhonchi Abdominal: Soft, NT, ND, bowel sounds + Extremities: no edema, no cyanosis    The results of significant diagnostics from this hospitalization (including imaging, microbiology, ancillary and laboratory) are listed below for reference.     Microbiology: Recent Results (from the past 240 hour(s))  SARS Coronavirus 2 [facility] order, Performed in [facility] hospital lab) Nasopharyngeal Nasopharyngeal Swab     Status: None   Collection Time: 11/06/18 11:18 AM   Specimen: Nasopharyngeal Swab  Result Value Ref Range Status   SARS Coronavirus 2 NEGATIVE NEGATIVE Final    Comment: (NOTE) If result is NEGATIVE SARS-CoV-2 target nucleic acids are NOT DETECTED. The SARS-CoV-2 RNA is generally detectable in upper and lower  respiratory specimens during the acute phase of infection. The lowest  concentration of SARS-CoV-2 viral copies this assay can detect is 250  copies / mL. A negative result does not preclude SARS-CoV-2 infection  and should not be used as the sole basis for treatment or other  patient management decisions.  A negative result may occur with  improper specimen collection / handling, submission of specimen other  than nasopharyngeal swab, presence of viral mutation(s) within the  areas targeted by this assay, and inadequate number of viral copies  (<250 copies / mL). A negative result must be combined with clinical  observations, patient history, and epidemiological information. If result is POSITIVE SARS-CoV-2 target nucleic acids are DETECTED. The SARS-CoV-2 RNA is  generally detectable in upper and lower  respiratory specimens dur ing the acute phase of infection.  Positive  results are indicative of active infection with SARS-CoV-2.  Clinical  correlation with patient history and other diagnostic information is  necessary to determine patient infection status.  Positive results do  not rule out bacterial infection or co-infection with other viruses. If result is PRESUMPTIVE POSTIVE SARS-CoV-2 nucleic acids MAY BE PRESENT.   A presumptive positive result was obtained on the submitted specimen  and confirmed on repeat testing.  While 2019 novel coronavirus  (SARS-CoV-2) nucleic acids may be present in the submitted sample  additional confirmatory testing may be necessary for epidemiological  and / or clinical management purposes  to differentiate between  SARS-CoV-2 and other Sarbecovirus currently known to infect humans.  If clinically indicated additional testing with an alternate test  methodology [phone]) is advised. The SARS-CoV-2 RNA is generally  detectable in upper and lower respiratory sp ecimens during the acute  phase of infection. The expected result is Negative. Fact Sheet for Patients:  StrictlyIdeas.no Fact Sheet for Healthcare Providers: BankingDealers.co.za This test is not yet approved or cleared by the [place] FDA and has been authorized for detection and/or diagnosis of SARS-CoV-2 by FDA under an Emergency Use Authorization (EUA).  This EUA will remain in effect (meaning this test can be used) for  the duration of the COVID-19 declaration under Section 564(b)(1) of the Act, 21 U.S.C. section 360bbb-3(b)(1), unless the authorization is terminated or revoked sooner. Performed at [facility], [address] [place], [place] [address]   Culture, blood (routine x 2)     Status: None (Preliminary result)   Collection Time: 11/09/18  7:33 AM   Specimen: BLOOD LEFT  HAND  Result Value Ref Range Status   Specimen Description   Final    BLOOD LEFT HAND Performed at [facility] [address], [place], [place] [address]    Special Requests   Final    BOTTLES DRAWN AEROBIC ONLY Blood Culture adequate volume Performed at [facility] [address], [place], [place] [address]    Culture   Final    NO GROWTH < 24 HOURS Performed at [facility] [address] [place], [place] [address]    Report Status PENDING  Incomplete  Culture, blood (routine x 2)     Status: None (Preliminary result)   Collection Time: 11/09/18  7:36 AM   Specimen: BLOOD  Result Value Ref Range Status   Specimen Description   Final    BLOOD LEFT ARM Performed at [facility] [address], [place], [place] [address]    Special Requests   Final    BOTTLES DRAWN AEROBIC AND ANAEROBIC Blood Culture adequate volume Performed at [facility] [address], [place], [place] [address]    Culture   Final    NO GROWTH < 24 HOURS Performed at [facility] [address], [place],  [address]    Report Status PENDING  Incomplete     Labs: BNP (last 3 results) No results for input(s): BNP in the last 8760 hours. Basic Metabolic Panel: Recent Labs  Lab 11/06/18 0825 11/07/18 0404 11/08/18 0507 11/09/18 0425 11/10/18 0403  NA 131* 134* 132* 131* 134*  K 3.1* 3.2* 3.4* 4.2 4.0  CL 88* 93* 95* 98 101  CO2 _0 GLUCOSE 160* 109* 110* 129* 119*  BUN _1 CREATININE 0.86 0.96 0.79 0.69 0.57*  CALCIUM 8.9 8.2* 8.1* 8.0* 8.1*  MG  --   --   --  2.0  --    Liver Function Tests: Recent Labs  Lab 11/06/18 0825 11/07/18 0404 11/08/18 0507 11/09/18 0425 11/10/18 0403  AST 97* 142* 173* 198* 184*  ALT 140* 143* 167* 203* 227*  ALKPHOS 430* 439* 579* 635* 682*  BILITOT 7.7* 8.4* 9.1* 9.8* 10.4*  PROT 7.1 5.7* 5.6* 5.6* 5.5*  ALBUMIN 3.0* 2.4* 2.3*  2.1* 2.0*   Recent Labs  Lab 11/06/18 0825  LIPASE 19   No results for input(s): AMMONIA in the last 168 hours. CBC: Recent Labs  Lab 11/06/18 0825 11/07/18 0404 11/08/18 0507 11/09/18 0425 11/10/18 0403  WBC 20.2* 13.5* 14.8* 18.0* 16.9*  NEUTROABS 17.1*  --   --   --   --   HGB 16.2 13.8 12.7* 13.0 13.0  HCT 49.0 42.4 40.0 41.3 40.6  MCV 80.9 84.5 84.9 85.7 84.2  PLT 268 245 237 253 243   Cardiac Enzymes: Recent Labs  Lab 11/06/18 1924  CKTOTAL 25*  CKMB 1.2   BNP: Invalid input(s): POCBNP CBG: No results for input(s): GLUCAP in the last 168 hours. D-Dimer No results for input(s): DDIMER in the last 72 hours. Hgb A1c No results  for input(s): HGBA1C in the last 72 hours. Lipid Profile No results for input(s): CHOL, HDL, LDLCALC, TRIG, CHOLHDL, LDLDIRECT in the last 72 hours. Thyroid function studies No results for input(s): TSH, T4TOTAL, T3FREE, THYROIDAB in the last 72 hours.  Invalid input(s): FREET3 Anemia work up No results for input(s): VITAMINB12, FOLATE, FERRITIN, TIBC, IRON, RETICCTPCT in the last 72 hours. Urinalysis    Component Value Date/Time   COLORURINE AMBER (A) 11/06/2018 1905   APPEARANCEUR CLEAR 11/06/2018 1905   LABSPEC 1.026 11/06/2018 1905   PHURINE 6.0 11/06/2018 1905   GLUCOSEU NEGATIVE 11/06/2018 1905   HGBUR SMALL (A) 11/06/2018 1905   BILIRUBINUR MODERATE (A) 11/06/2018 1905   KETONESUR 20 (A) 11/06/2018 1905   PROTEINUR 30 (A) 11/06/2018 1905   NITRITE NEGATIVE 11/06/2018 1905   LEUKOCYTESUR NEGATIVE 11/06/2018 1905   Sepsis Labs Invalid input(s): PROCALCITONIN,  WBC,  LACTICIDVEN Microbiology Recent Results (from the past 240 hour(s))  SARS Coronavirus 2 [facility] order, Performed in [facility] hospital lab) Nasopharyngeal Nasopharyngeal Swab     Status: None   Collection Time: 11/06/18 11:18 AM   Specimen: Nasopharyngeal Swab  Result Value Ref Range Status   SARS Coronavirus 2 NEGATIVE NEGATIVE Final    Comment:  (NOTE) If result is NEGATIVE SARS-CoV-2 target nucleic acids are NOT DETECTED. The SARS-CoV-2 RNA is generally detectable in upper and lower  respiratory specimens during the acute phase of infection. The lowest  concentration of SARS-CoV-2 viral copies this assay can detect is 250  copies / mL. A negative result does not preclude SARS-CoV-2 infection  and should not be used as the sole basis for treatment or other  patient management decisions.  A negative result may occur with  improper specimen collection / handling, submission of specimen other  than nasopharyngeal swab, presence of viral mutation(s) within the  areas targeted by this assay, and inadequate number of viral copies  (<250 copies / mL). A negative result must be combined with clinical  observations, patient history, and epidemiological information. If result is POSITIVE SARS-CoV-2 target nucleic acids are DETECTED. The SARS-CoV-2 RNA is generally detectable in upper and lower  respiratory specimens dur ing the acute phase of infection.  Positive  results are indicative of active infection with SARS-CoV-2.  Clinical  correlation with patient history and other diagnostic information is  necessary to determine patient infection status.  Positive results do  not rule out bacterial infection or co-infection with other viruses. If result is PRESUMPTIVE POSTIVE SARS-CoV-2 nucleic acids MAY BE PRESENT.   A presumptive positive result was obtained on the submitted specimen  and confirmed on repeat testing.  While 2019 novel coronavirus  (SARS-CoV-2) nucleic acids may be present in the submitted sample  additional confirmatory testing may be necessary for epidemiological  and / or clinical management purposes  to differentiate between  SARS-CoV-2 and other Sarbecovirus currently known to infect humans.  If clinically indicated additional testing with an alternate test  methodology [phone]) is advised. The SARS-CoV-2 RNA is  generally  detectable in upper and lower respiratory sp ecimens during the acute  phase of infection. The expected result is Negative. Fact Sheet for Patients:  StrictlyIdeas.no Fact Sheet for Healthcare Providers: BankingDealers.co.za This test is not yet approved or cleared by the [place] FDA and has been authorized for detection and/or diagnosis of SARS-CoV-2 by FDA under an Emergency Use Authorization (EUA).  This EUA will remain in effect (meaning this test can be used) for the duration of the COVID-19  declaration under Section 564(b)(1) of the Act, 21 U.S.C. section 360bbb-3(b)(1), unless the authorization is terminated or revoked sooner. Performed at [facility], [address] [place], [place] [address]   Culture, blood (routine x 2)     Status: None (Preliminary result)   Collection Time: 11/09/18  7:33 AM   Specimen: BLOOD LEFT HAND  Result Value Ref Range Status   Specimen Description   Final    BLOOD LEFT HAND Performed at [facility] [address], [place], [place] [address]    Special Requests   Final    BOTTLES DRAWN AEROBIC ONLY Blood Culture adequate volume Performed at [facility] [address], [place], [place] [address]    Culture   Final    NO GROWTH < 24 HOURS Performed at [facility] [address] [place], [place] [address]    Report Status PENDING  Incomplete  Culture, blood (routine x 2)     Status: None (Preliminary result)   Collection Time: 11/09/18  7:36 AM   Specimen: BLOOD  Result Value Ref Range Status   Specimen Description   Final    BLOOD LEFT ARM Performed at [facility] [address] [place], [place] [address]    Special Requests   Final    BOTTLES DRAWN AEROBIC AND ANAEROBIC Blood Culture adequate volume Performed at [facility] [address], [place], [place] [address]     Culture   Final    NO GROWTH < 24 HOURS Performed at [facility] [address] [place],  [address]    Report Status PENDING  Incomplete     Time coordinating discharge: Over 30 minutes  SIGNED:   Eric J [place] DO  [facility] 11/10/2018, 2:38 PM

## 2018-11-10 NOTE — Progress Notes (Signed)
    Progress Note   Subjective  He is feeling much better today Eating a full diet without pain Had a normal bowel movement yesterday No bleeding No itching No recurrent fevers Hoping to go home   Objective  Vital signs in last 24 hours: Temp:  [97.9 F (36.6 C)-100.2 F (37.9 C)] 100.2 F (37.9 C) (10/02 1214) Pulse Rate:  [85-101] 98 (10/02 1214) Resp:  [16-20] 20 (10/02 1214) BP: (124-132)/(81-91) 124/87 (10/02 1214) SpO2:  [97 %-98 %] 97 % (10/02 1214) Weight:  [79.8 kg] 79.8 kg (10/02 0525) Last BM Date: 11/09/18  General: Alert, well-developed, in NAD, icteric sclera Heart: Tachycardic but regular Chest: Clear to ascultation bilaterally Abdomen:  Soft, nontender and nondistended. Normal bowel sounds, without guarding, and without rebound.   Extremities:  Without edema. Neurologic:  Alert and  oriented x4; grossly normal neurologically.  No asterixis Psych:  Alert and cooperative. Normal mood and affect.  Intake/Output from previous day: 10/01 0701 - 10/02 0700 In: 2516.4 [P.O.:720; I.V.:1796.4] Out: -  Intake/Output this shift: No intake/output data recorded.  Lab Results: Recent Labs    11/08/18 0507 11/09/18 0425 11/10/18 0403  WBC 14.8* 18.0* 16.9*  HGB 12.7* 13.0 13.0  HCT 40.0 41.3 40.6  PLT 237 253 243   BMET Recent Labs    11/08/18 0507 11/09/18 0425 11/10/18 0403  NA 132* 131* 134*  K 3.4* 4.2 4.0  CL 95* 98 101  CO2 27 24 23   GLUCOSE 110* 129* 119*  BUN 11 8 9   CREATININE 0.79 0.69 0.57*  CALCIUM 8.1* 8.0* 8.1*   LFT Recent Labs    11/10/18 0403  PROT 5.5*  ALBUMIN 2.0*  AST 184*  ALT 227*  ALKPHOS 682*  BILITOT 10.4*   PT/INR Recent Labs    11/09/18 0425 11/10/18 0403  LABPROT 16.4* 14.8  INR 1.3* 1.2   Hepatitis Panel No results for input(s): HEPBSAG, HCVAB, HEPAIGM, HEPBIGM in the last 72 hours.  Studies/Results: No results found.    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 Okay to go home from GI perspective  He needs to return to our office on Monday, 11/13/2018 for lab work.  I made him aware of our office location and he knows to come for labs on Monday.  Lab findings will dictate need for follow-up.  He is advised to avoid alcohol and over-the-counter medicines.  Max Tylenol 2 g/day    LOS: 4 days   Jerene Bears  11/10/2018, 1:00 PM

## 2018-11-10 NOTE — Progress Notes (Signed)
[facility] for Infectious Disease   Reason for visit: Follow up on cholestatic jaundice  Interval History: LFTs about the same, patient tolerating po, WBC 16.9, afebrile.  No acute events.  No associated rash.    Physical Exam: Constitutional:  Vitals:   11/09/18 2213 11/10/18 0525  BP: (!) 132/91 127/81  Pulse: 85 (!) 101  Resp: 16 16  Temp: 97.9 F (36.6 C) 98.9 F (37.2 C)  SpO2: 98% 98%   patient appears in NAD Eyes: icteric Respiratory: Normal respiratory effort; CTA B Cardiovascular: RRR GI: soft, nt, nd  Review of Systems: Constitutional: negative for fevers, chills and anorexia Gastrointestinal: negative for nausea and diarrhea Musculoskeletal: negative for myalgias and arthralgias  Lab Results  Component Value Date   WBC 16.9 (H) 11/10/2018   HGB 13.0 11/10/2018   HCT 40.6 11/10/2018   MCV 84.2 11/10/2018   PLT 243 11/10/2018    Lab Results  Component Value Date   CREATININE 0.57 (L) 11/10/2018   BUN 9 11/10/2018   NA 134 (L) 11/10/2018   K 4.0 11/10/2018   CL 101 11/10/2018   CO2 23 11/10/2018    Lab Results  Component Value Date   ALT 227 (H) 11/10/2018   AST 184 (H) 11/10/2018   ALKPHOS 682 (H) 11/10/2018     Microbiology: Recent Results (from the past 240 hour(s))  SARS Coronavirus 2 [facility] order, Performed in [facility] hospital lab) Nasopharyngeal Nasopharyngeal Swab     Status: None   Collection Time: 11/06/18 11:18 AM   Specimen: Nasopharyngeal Swab  Result Value Ref Range Status   SARS Coronavirus 2 NEGATIVE NEGATIVE Final    Comment: (NOTE) If result is NEGATIVE SARS-CoV-2 target nucleic acids are NOT DETECTED. The SARS-CoV-2 RNA is generally detectable in upper and lower  respiratory specimens during the acute phase of infection. The lowest  concentration of SARS-CoV-2 viral copies this assay can detect is 250  copies / mL. A negative result does not preclude SARS-CoV-2 infection  and should not be used as the sole  basis for treatment or other  patient management decisions.  A negative result may occur with  improper specimen collection / handling, submission of specimen other  than nasopharyngeal swab, presence of viral mutation(s) within the  areas targeted by this assay, and inadequate number of viral copies  (<250 copies / mL). A negative result must be combined with clinical  observations, patient history, and epidemiological information. If result is POSITIVE SARS-CoV-2 target nucleic acids are DETECTED. The SARS-CoV-2 RNA is generally detectable in upper and lower  respiratory specimens dur ing the acute phase of infection.  Positive  results are indicative of active infection with SARS-CoV-2.  Clinical  correlation with patient history and other diagnostic information is  necessary to determine patient infection status.  Positive results do  not rule out bacterial infection or co-infection with other viruses. If result is PRESUMPTIVE POSTIVE SARS-CoV-2 nucleic acids MAY BE PRESENT.   A presumptive positive result was obtained on the submitted specimen  and confirmed on repeat testing.  While 2019 novel coronavirus  (SARS-CoV-2) nucleic acids may be present in the submitted sample  additional confirmatory testing may be necessary for epidemiological  and / or clinical management purposes  to differentiate between  SARS-CoV-2 and other Sarbecovirus currently known to infect humans.  If clinically indicated additional testing with an alternate test  methodology [phone]) is advised. The SARS-CoV-2 RNA is generally  detectable in upper and lower respiratory sp  ecimens during the acute  phase of infection. The expected result is Negative. Fact Sheet for Patients:  BoilerBrush.com.cy Fact Sheet for Healthcare Providers: https://pope.com/ This test is not yet approved or cleared by the [place] FDA and has been authorized for detection  and/or diagnosis of SARS-CoV-2 by FDA under an Emergency Use Authorization (EUA).  This EUA will remain in effect (meaning this test can be used) for the duration of the COVID-19 declaration under Section 564(b)(1) of the Act, 21 U.S.C. section 360bbb-3(b)(1), unless the authorization is terminated or revoked sooner. Performed at [facility], [address] [place], [place] [address]   Culture, blood (routine x 2)     Status: None (Preliminary result)   Collection Time: 11/09/18  7:33 AM   Specimen: BLOOD LEFT HAND  Result Value Ref Range Status   Specimen Description   Final    BLOOD LEFT HAND Performed at [facility], [address] [place], [place] [address]    Special Requests   Final    BOTTLES DRAWN AEROBIC ONLY Blood Culture adequate volume Performed at [facility], [address], [place], [place] [address]    Culture   Final    NO GROWTH < 24 HOURS Performed at [facility], [address], [place], [place] [address]    Report Status PENDING  Incomplete  Culture, blood (routine x 2)     Status: None (Preliminary result)   Collection Time: 11/09/18  7:36 AM   Specimen: BLOOD  Result Value Ref Range Status   Specimen Description   Final    BLOOD LEFT ARM Performed at [facility], [address], [place], [place] [address]    Special Requests   Final    BOTTLES DRAWN AEROBIC AND ANAEROBIC Blood Culture adequate volume Performed at [facility], [address] [place], [place] [address]    Culture   Final    NO GROWTH < 24 HOURS Performed at [facility], [address] [place], [place] [address]    Report Status PENDING  Incomplete    Impression/Plan:  1. 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  I agree with GI that this can be followed as an outpatient now that he is doing well.  OK from ID standpoint for discharge.  2.  Allergy -  Azithromycin now listed as an allergy  3.  URI - resolved and COVID negative  I will sign off and plan for follow up with GI noted.   Thanks for consultation

## 2018-11-11 LAB — HIV-1 RNA QUANT-NO REFLEX-BLD
HIV 1 RNA Quant: <20 copies/mL
LOG10 HIV-1 RNA: UNDETERMINED log10copy/mL

## 2018-11-12 LAB — MITOCHONDRIAL ANTIBODIES: Mitochondrial M2 Ab, IgG: <20 Units (ref 0.0–20.0)

## 2018-11-13 ENCOUNTER — Other Ambulatory Visit (INDEPENDENT_AMBULATORY_CARE_PROVIDER_SITE_OTHER): Payer: Self-pay

## 2018-11-13 DIAGNOSIS — R945 Abnormal results of liver function studies: Secondary | ICD-10-CM

## 2018-11-13 LAB — CBC WITH DIFFERENTIAL/PLATELET
Basophils Absolute: 0.1 10*3/uL (ref 0.0–0.1)
Basophils Relative: 0.5 % (ref 0.0–3.0)
Eosinophils Absolute: 0.5 10*3/uL (ref 0.0–0.7)
Eosinophils Relative: 2.9 % (ref 0.0–5.0)
HCT: 39.5 % (ref 39.0–52.0)
Hemoglobin: 12.8 g/dL — ABNORMAL LOW (ref 13.0–17.0)
Lymphocytes Relative: 20.3 % (ref 12.0–46.0)
Lymphs Abs: 3.3 10*3/uL (ref 0.7–4.0)
MCHC: 32.4 g/dL (ref 30.0–36.0)
MCV: 84.5 fl (ref 78.0–100.0)
Monocytes Absolute: 1.2 10*3/uL — ABNORMAL HIGH (ref 0.1–1.0)
Monocytes Relative: 7.3 % (ref 3.0–12.0)
Neutro Abs: 11.2 10*3/uL — ABNORMAL HIGH (ref 1.4–7.7)
Neutrophils Relative %: 69 % (ref 43.0–77.0)
Platelets: 441 10*3/uL — ABNORMAL HIGH (ref 150.0–400.0)
RBC: 4.68 Mil/uL (ref 4.22–5.81)
RDW: 14.1 % (ref 11.5–15.5)
WBC: 16.2 10*3/uL — ABNORMAL HIGH (ref 4.0–10.5)

## 2018-11-13 LAB — HEPATIC FUNCTION PANEL
ALT: 258 U/L — ABNORMAL HIGH (ref 0–53)
AST: 161 U/L — ABNORMAL HIGH (ref 0–37)
Albumin: 3.1 g/dL — ABNORMAL LOW (ref 3.5–5.2)
Alkaline Phosphatase: 1157 U/L — ABNORMAL HIGH (ref 39–117)
Bilirubin, Direct: 7.3 mg/dL — ABNORMAL HIGH (ref 0.0–0.3)
Total Bilirubin: 12.4 mg/dL — ABNORMAL HIGH (ref 0.2–1.2)
Total Protein: 6.9 g/dL (ref 6.0–8.3)

## 2018-11-13 LAB — COMPREHENSIVE METABOLIC PANEL
ALT: 258 U/L — ABNORMAL HIGH (ref 0–53)
AST: 161 U/L — ABNORMAL HIGH (ref 0–37)
Albumin: 3.1 g/dL — ABNORMAL LOW (ref 3.5–5.2)
Alkaline Phosphatase: 1157 U/L — ABNORMAL HIGH (ref 39–117)
BUN: 11 mg/dL (ref 6–23)
CO2: 28 mEq/L (ref 19–32)
Calcium: 9 mg/dL (ref 8.4–10.5)
Chloride: 95 mEq/L — ABNORMAL LOW (ref 96–112)
Creatinine, Ser: 0.85 mg/dL (ref 0.40–1.50)
GFR: 107.65 mL/min (ref >60.00)
Glucose, Bld: 128 mg/dL — ABNORMAL HIGH (ref 70–99)
Potassium: 4.4 mEq/L (ref 3.5–5.1)
Sodium: 131 mEq/L — ABNORMAL LOW (ref 135–145)
Total Bilirubin: 12.4 mg/dL — ABNORMAL HIGH (ref 0.2–1.2)
Total Protein: 6.9 g/dL (ref 6.0–8.3)

## 2018-11-13 LAB — PROTIME-INR
INR: 1.4 ratio — ABNORMAL HIGH (ref 0.8–1.0)
Prothrombin Time: 16.1 s — ABNORMAL HIGH (ref 9.6–13.1)

## 2018-11-13 LAB — EPSTEIN BARR VRS(EBV DNA BY PCR)
EBV DNA QN by PCR: 210 copies/mL
log10 EBV DNA Qn PCR: 2.322 log10 copy/mL

## 2018-11-14 ENCOUNTER — Telehealth: Payer: Self-pay | Admitting: Internal Medicine

## 2018-11-14 LAB — CULTURE, BLOOD (ROUTINE X 2)
Culture: NO GROWTH
Culture: NO GROWTH
Special Requests: ADEQUATE
Special Requests: ADEQUATE

## 2018-11-14 NOTE — Telephone Encounter (Signed)
See result note from labs I did yesterday -- details located there I have spoken to [facility] who will see him Thursday - however he is uninsured and may make outpatient eval difficult.  That said, he will be seen at [facility] and I strongly recommend he keep the appt.

## 2018-11-14 NOTE — Progress Notes (Signed)
Dr. Hilarie Fredrickson following up - see phone notes

## 2018-11-14 NOTE — Telephone Encounter (Signed)
Patient, I believe, is returning your call from today at 4:00

## 2018-11-14 NOTE — Telephone Encounter (Signed)
Patient notified of Dr. Gessner's recommendations 

## 2018-11-14 NOTE — Telephone Encounter (Signed)
I spoke to [facility] by phone He continues to feel better but he has developed itching.  He has noticed yellow eyes and skin.  No further red eyes.  Mild blurry vision.  No abdominal pain or nausea.  Able to eat and drink.  Bowel movements have been regular.  No bleeding.  No lower extremity edema.  No confusion.  No increased sleepiness.  He is focusing on hydration.  He will be seen by [facility] on Thursday morning.  He knows about the MRI/MRCP but is worried about medical expenses without insurance.  While he was in the hospital he was told he may qualify for emergency Medicaid and that paperwork would be mailed to him.  He has not received this.  I also told him about the orange card which may help with local assistance.  We will wait to hear back from [facility] once he is seen.  Please send him info on local assistant/orange card or point him in the right direction on whom he can call regarding local assistance.  I encouraged him to reach out to [facility] program, please do the same when you let him know about orange card

## 2018-11-14 NOTE — Progress Notes (Signed)
+   EBV DNA Dr. Hilarie Fredrickson is primary Gi and aware and coordinating f/u

## 2018-11-14 NOTE — Telephone Encounter (Signed)
Let patient know the numbers are worse  Dr. Hilarie Fredrickson is also following up on this and it is my understanding he plans to call patient with advice for next steps

## 2018-11-14 NOTE — Telephone Encounter (Signed)
Patient asking for lab results from yesterday

## 2018-11-16 NOTE — Telephone Encounter (Signed)
Patient notified that I am sending him a [facility] assistance program form.  He is asked to fill out and send in per the instructions on page 2

## 2018-11-22 ENCOUNTER — Observation Stay (HOSPITAL_COMMUNITY)
Admission: EM | Admit: 2018-11-22 | Discharge: 2018-11-23 | Disposition: A | Discharge status: Home / Self Care | Payer: Self-pay | Attending: Family Medicine | Admitting: Family Medicine

## 2018-11-22 ENCOUNTER — Encounter (HOSPITAL_COMMUNITY): Payer: Self-pay

## 2018-11-22 ENCOUNTER — Other Ambulatory Visit: Payer: Self-pay

## 2018-11-22 ENCOUNTER — Observation Stay (HOSPITAL_COMMUNITY): Payer: Self-pay

## 2018-11-22 DIAGNOSIS — Z20828 Contact with and (suspected) exposure to other viral communicable diseases: Secondary | ICD-10-CM | POA: Insufficient documentation

## 2018-11-22 DIAGNOSIS — R748 Abnormal levels of other serum enzymes: Secondary | ICD-10-CM | POA: Diagnosis present

## 2018-11-22 DIAGNOSIS — R7989 Other specified abnormal findings of blood chemistry: Secondary | ICD-10-CM

## 2018-11-22 DIAGNOSIS — K7589 Other specified inflammatory liver diseases: Principal | ICD-10-CM | POA: Insufficient documentation

## 2018-11-22 DIAGNOSIS — K759 Inflammatory liver disease, unspecified: Secondary | ICD-10-CM

## 2018-11-22 DIAGNOSIS — R7401 Elevation of levels of liver transaminase levels: Secondary | ICD-10-CM

## 2018-11-22 DIAGNOSIS — R945 Abnormal results of liver function studies: Secondary | ICD-10-CM | POA: Insufficient documentation

## 2018-11-22 DIAGNOSIS — Z881 Allergy status to other antibiotic agents status: Secondary | ICD-10-CM | POA: Insufficient documentation

## 2018-11-22 DIAGNOSIS — R109 Unspecified abdominal pain: Secondary | ICD-10-CM

## 2018-11-22 DIAGNOSIS — R17 Unspecified jaundice: Secondary | ICD-10-CM

## 2018-11-22 LAB — COMPREHENSIVE METABOLIC PANEL
ALT: 346 U/L — ABNORMAL HIGH (ref 0–44)
AST: 171 U/L — ABNORMAL HIGH (ref 15–41)
Albumin: 3 g/dL — ABNORMAL LOW (ref 3.5–5.0)
Alkaline Phosphatase: 1731 U/L — ABNORMAL HIGH (ref 38–126)
Anion gap: 12 (ref 5–15)
BUN: 12 mg/dL (ref 6–20)
CO2: 27 mmol/L (ref 22–32)
Calcium: 9.3 mg/dL (ref 8.9–10.3)
Chloride: 93 mmol/L — ABNORMAL LOW (ref 98–111)
Creatinine, Ser: 0.58 mg/dL — ABNORMAL LOW (ref 0.61–1.24)
GFR calc Af Amer: >60 mL/min (ref >60)
GFR calc non Af Amer: >60 mL/min (ref >60)
Glucose, Bld: 101 mg/dL — ABNORMAL HIGH (ref 70–99)
Potassium: 3.7 mmol/L (ref 3.5–5.1)
Sodium: 132 mmol/L — ABNORMAL LOW (ref 135–145)
Total Bilirubin: 16 mg/dL — ABNORMAL HIGH (ref 0.3–1.2)
Total Protein: 7.7 g/dL (ref 6.5–8.1)

## 2018-11-22 LAB — CBC WITH DIFFERENTIAL/PLATELET
Abs Immature Granulocytes: 0.17 10*3/uL — ABNORMAL HIGH (ref 0.00–0.07)
Basophils Absolute: 0.1 10*3/uL (ref 0.0–0.1)
Basophils Relative: 1 %
Eosinophils Absolute: 0.1 10*3/uL (ref 0.0–0.5)
Eosinophils Relative: 2 %
HCT: 39.4 % (ref 39.0–52.0)
Hemoglobin: 12.2 g/dL — ABNORMAL LOW (ref 13.0–17.0)
Immature Granulocytes: 2 %
Lymphocytes Relative: 34 %
Lymphs Abs: 3 10*3/uL (ref 0.7–4.0)
MCH: 26.9 pg (ref 26.0–34.0)
MCHC: 31 g/dL (ref 30.0–36.0)
MCV: 87 fL (ref 80.0–100.0)
Monocytes Absolute: 0.5 10*3/uL (ref 0.1–1.0)
Monocytes Relative: 6 %
Neutro Abs: 4.8 10*3/uL (ref 1.7–7.7)
Neutrophils Relative %: 55 %
Platelets: 492 10*3/uL — ABNORMAL HIGH (ref 150–400)
RBC: 4.53 MIL/uL (ref 4.22–5.81)
RDW: 16 % — ABNORMAL HIGH (ref 11.5–15.5)
WBC: 8.8 10*3/uL (ref 4.0–10.5)
nRBC: 0 % (ref 0.0–0.2)

## 2018-11-22 LAB — PROTIME-INR
INR: 1 (ref 0.8–1.2)
Prothrombin Time: 13 seconds (ref 11.4–15.2)

## 2018-11-22 LAB — SARS CORONAVIRUS 2 BY RT PCR (HOSPITAL ORDER, PERFORMED IN ~~LOC~~ HOSPITAL LAB): SARS Coronavirus 2: NEGATIVE

## 2018-11-22 LAB — LIPASE, BLOOD: Lipase: 20 U/L (ref 11–51)

## 2018-11-22 MED ORDER — SENNOSIDES-DOCUSATE SODIUM 8.6-50 MG PO TABS: 1.0000 | ORAL_TABLET | Freq: Every evening | ORAL | Status: DC | PRN

## 2018-11-22 MED ORDER — GADOBUTROL 1 MMOL/ML IV SOLN
7.5000 mL | Freq: Once | INTRAVENOUS | Status: AC | PRN
  Administered 2018-11-22: 20:00:00 7.5 mL via INTRAVENOUS

## 2018-11-22 MED ORDER — ONDANSETRON HCL 4 MG/2ML IJ SOLN: 4.0000 mg | Freq: Four times a day (QID) | INTRAMUSCULAR | Status: DC | PRN

## 2018-11-22 MED ORDER — SODIUM CHLORIDE 0.9 % IV SOLN
INTRAVENOUS | Status: DC
  Administered 2018-11-22 – 2018-11-23 (×3): via INTRAVENOUS

## 2018-11-22 MED ORDER — ONDANSETRON HCL 4 MG PO TABS: 4.0000 mg | ORAL_TABLET | Freq: Four times a day (QID) | ORAL | Status: DC | PRN

## 2018-11-22 MED ORDER — SODIUM CHLORIDE 0.9% FLUSH: 3.0000 mL | Freq: Two times a day (BID) | INTRAVENOUS | Status: DC

## 2018-11-22 MED ORDER — IBUPROFEN 200 MG PO TABS: 200.0000 mg | ORAL_TABLET | Freq: Four times a day (QID) | ORAL | Status: DC | PRN

## 2018-11-22 NOTE — ED Provider Notes (Signed)
[facility] DEPT Provider Note   CSN: [id number] Arrival date & time: 11/22/18  0174     History   Chief Complaint Chief Complaint  Patient presents with  . Abnormal Lab    HPI Gregory [name] is a 28 y.o. male past medical history of ADHD, bipolar who presents for evaluation of worsening liver labs.  Patient seen in consultation with [facility] for transaminitis.  He had been seen in the ED on 11/06/18 and had been admitted for new onset transaminitis.  His liver functions had improved but were still elevated.  He had been followed by our GI and was supposed to get MRCP and liver biopsy but has been unable to get it done.  GI checked his labs the other day and noted worsening and prompted her to come the emergency department for further evaluation.  He states that he has not had any more pain.  He has been able to eat and drink which is improvement from his previous admission.  He has not noted any vomiting.  He states he has had to continue to jaundice but states he does not feel like it is getting worse.  Denies any fevers, difficulty breathing, chest pain.     The history is provided by the patient.    Past Medical History:  Diagnosis Date  . ADHD (attention deficit hyperactivity disorder)    denies taking meds  . Bipolar disorder ([place])    Pt reports that this runs in his family but he has never been officially diagnosed. Pt reports that his mom used to give him Seroquel as a child but "made me feel like a zombie so I wouldn't take it".  . Headache(784.0)   . Pneumonia     Patient Active Problem List   Diagnosis Date Noted  . Jaundice   . Non-intractable vomiting   . Splenomegaly   . Fever, unspecified   . SIRS (systemic inflammatory response syndrome) ([place]) 11/06/2018  . Sepsis ([place]) 11/06/2018  . Abnormal liver function 11/06/2018  . Leukocytosis 11/06/2018  . Acute lower UTI 11/06/2018  . Hypokalemia 11/06/2018  . Pain in right  testicle 11/07/2017  . ACL tear 07/03/2013    Past Surgical History:  Procedure Laterality Date  . ANTERIOR CRUCIATE LIGAMENT REPAIR Right 07/03/2013   Procedure: RECONSTRUCTION ANTERIOR CRUCIATE LIGAMENT (ACL) WITH HAMSTRING GRAFT;  Surgeon: Meredith Pel, MD;  Location: [facility];  Service: Orthopedics;  Laterality: Right;  RIGHT KNEE ANTERIOR CRUCIATE LIGAMENT RECONSTRUCTION, POSTERIOR CRUCIATE LIGAMENT RECONSTRUCTION, MENISCAL  DEBRIDEMENT, HAMSTRING AUTOGRAFT.  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 Pt reports that heart lining of cow was then attached so that tissue would expand as pt grew. Dr. Glenna Fellows performed surgery.  . FRACTURE SURGERY  2010   nasal fracture  . KNEE SURGERY    . POSTERIOR CRUCIATE LIGAMENT RECONSTRUCTION Right 07/03/2013   Procedure: RECONSTRUCTION OF FIBULAR COLLATERAL LIGAMENT;  Surgeon: Meredith Pel, MD;  Location: [facility];  Service: Orthopedics;  Laterality: Right;        Home Medications    Prior to Admission medications   Not on File    Family History Family History  Problem Relation Age of Onset  . Diabetes Mother   . Chiari malformation Mother     Social History Social History   Tobacco Use  . Smoking status: Never Smoker  . Smokeless  tobacco: Never Used  Substance Use Topics  . Alcohol use: No  . Drug use: No     Allergies   Azithromycin   Review of Systems Review of Systems  Constitutional: Negative for fever.  Respiratory: Negative for cough and shortness of breath.   Cardiovascular: Negative for chest pain.  Gastrointestinal: Negative for abdominal pain, nausea and vomiting.  Genitourinary: Negative for dysuria and hematuria.  Neurological: Negative for headaches.  All other systems reviewed and are negative.    Physical Exam Updated Vital Signs BP 122/88   Pulse 94   Temp 99 F (37.2 C) (Oral)   Resp 15   Ht 6' (1.829 m)    Wt 73.5 kg   SpO2 100%   BMI 21.97 kg/m   Physical Exam Vitals signs and nursing note reviewed.  Constitutional:      Appearance: Normal appearance. He is well-developed.  HENT:     Head: Normocephalic and atraumatic.  Eyes:     General: Lids are normal.     Conjunctiva/sclera: Conjunctivae normal.     Pupils: Pupils are equal, round, and reactive to light.     Comments: Scleral icterus noted bilaterally.  Neck:     Musculoskeletal: Full passive range of motion without pain.  Cardiovascular:     Rate and Rhythm: Normal rate and regular rhythm.     Pulses: Normal pulses.     Heart sounds: Normal heart sounds. No murmur. No friction rub. No gallop.   Pulmonary:     Effort: Pulmonary effort is normal.     Breath sounds: Normal breath sounds.  Abdominal:     Palpations: Abdomen is soft. Abdomen is not rigid.     Tenderness: There is no abdominal tenderness. There is no guarding.     Comments: Abdomen is soft, non-distended, non-tender. No rigidity, No guarding. No peritoneal signs.  Musculoskeletal: Normal range of motion.  Skin:    General: Skin is warm and dry.     Capillary Refill: Capillary refill takes less than 2 seconds.     Coloration: Skin is jaundiced.  Neurological:     Mental Status: He is alert and oriented to person, place, and time.  Psychiatric:        Speech: Speech normal.      ED Treatments / Results  Labs (all labs ordered are listed, but only abnormal results are displayed) Labs Reviewed  COMPREHENSIVE METABOLIC PANEL - Abnormal; Notable for the following components:      Result Value   Sodium 132 (*)    Chloride 93 (*)    Glucose, Bld 101 (*)    Creatinine, Ser 0.58 (*)    Albumin 3.0 (*)    AST 171 (*)    ALT 346 (*)    Alkaline Phosphatase 1,731 (*)    Total Bilirubin 16.0 (*)    All other components within normal limits  CBC WITH DIFFERENTIAL/PLATELET - Abnormal; Notable for the following components:   Hemoglobin 12.2 (*)    RDW 16.0  (*)    Platelets 492 (*)    Abs Immature Granulocytes 0.17 (*)    All other components within normal limits  SARS CORONAVIRUS 2 BY RT PCR (HOSPITAL ORDER, [place] [facility])  PROTIME-INR  LIPASE, BLOOD    EKG None  Radiology No results found.  Procedures Procedures (including critical care time)  Medications Ordered in ED Medications - No data to display   Initial Impression / Assessment and Plan / ED Course  I have reviewed the triage vital signs and the nursing notes.  Pertinent labs & imaging results that were available during my care of the patient were reviewed by me and considered in my medical decision making (see chart for details).        28 year old male with known transaminitis, hyperbilirubinemia who presents for evaluation of worsening lab work.  Patient seen in consultation with GI and they noted worsening blood work and prompted to come to the emergency department for further evaluation.  Denies any pain, nausea/vomiting.  He is jaundiced but states that this is not new. Patient is afebrile, non-toxic appearing, sitting comfortably on examination table. Vital signs reviewed and stable.   CMP shows sodium 132, BUN of 12, creatinine of 0.58.  AST is 177, ALT of 346, alk phos of 6 1731, bili of 16.  Lipase unremarkable.  CBC without significant leukocytosis or anemia.  INR is 1.0  Discussed patient with [facility].  They would like patient admitted.  They are requesting medical admission.  Plans for MR CP and liver biopsy.  Hospitalist paged for admission.  Discussed patient with hospitalist. Plan for admission.   Portions of this note were generated with [employer]. Dictation errors may occur despite best attempts at proofreading.    Final Clinical Impressions(s) / ED Diagnoses   Final diagnoses:  Transaminitis  Hyperbilirubinemia  Jaundice    ED Discharge Orders    None       Volanda Napoleon, PA-C 11/22/18  Golva, Ripley, DO 11/22/18 1504

## 2018-11-22 NOTE — ED Triage Notes (Signed)
Patient reports he is here for a liver biopsy and was told to come in by his PCP and GI. Patient has had blood work done multiple times and reports his bilirubin levels are still increasing as well as his liver levels. Patient reports he was sent here for biopsy.

## 2018-11-22 NOTE — Consult Note (Addendum)
Referring Provider:  [facility]        Primary Care Physician:  Patient, No Pcp Per Primary Gastroenterologist:   Zenovia Jarred, MD          Reason for Consultation:  Worsening liver test                 ASSESSMENT /  PLAN     28 yo male with recent admission for fever, cough, hepatosplenomegaly, abnormal liver tests. Etiology never determined. ID evaluated and workup negative except for low copies of EBV but negative IgM. Ultimately felt to have had acute viral illness with superimposed DILI ( Azithromycin + 4 grams tylenol daily) Liver tests progressively gotten worse since discharge. Physically he feels okay.  -Needs admission for further evaluation and management.  -I have ordered MRI / MRCP -Following MRCP he will need liver biopsy.  -After liver biopsy will treat empirically with steroids for ? Autoimmune liver disease.  -Daily liver tests, INR  HPI:     CAYLOR TALLARICO is a 28 y.o. male who was admitted late September with fevers / leukocytosis with left shift / hepatosplenomegaly / and markedly abnormal liver tests. [place] raised concern for acalculous cholecystitis. 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 Gallbladder filling couldn't be assesses without excretion of radiotracer into extrahepatic biliary ducts. Given overall picture primary gallbladder disease wasn't really suspected. ID evaluated. CMV negative. EBV studies - low viral count.  HIV negative. Ultimately felt to have an acute viral illness with superimposed DILI from Azithromycin. Markers for autoimmune / genetic liver diseases were normal.   Events:  Patient starting coughing, having chest on 10/30/18. The following day he developed high fevers, lasted for two days and during this time was coughing up phlegm. On 11/02/18 had COVID test which was negative. Later in day he started Azithromycin and took until 9/27. Following Azithromycin he began having nausea /  vomiting / diarrhea. He had been taking 4 grams of Tylenol plus Nyquil while on Azithromycin. On 11/06/18 went to [facility]. Told needed gallbladder surgery. Transferred to [place] at that point.  A second COVID test was negative While admitted his symptoms quickly resolved, only low grade fevers. Discharged home on 11/10/18. Follow up labs at our office 3 days later showed worsening of alk phos, bilirubin, and INR. Enzymes slightly worse.  We referred to Roosevelt Locks, NP with [facility]. He saw her on 6th of Oct.  She asked him to return for labs in a week, he had it down on 12th. Called with results and told liver tests were worse. He was advised to come to ED. In between hospital discharge and seeing [place] patient had one dose of Ibuprofen, no other meds. He hasn't had any further N/V/D or fevers. Fatigue has been improving. No joint aches.    Past Medical History:  Diagnosis Date  . ADHD (attention deficit hyperactivity disorder)    denies taking meds  . Bipolar disorder ([place])    Pt reports that this runs in his family but he has never been officially diagnosed. Pt reports that his mom used to give him Seroquel as a child but "made me feel like a zombie so I wouldn't take it".  . Headache(784.0)   . Pneumonia     Past Surgical History:  Procedure Laterality Date  . ANTERIOR CRUCIATE LIGAMENT REPAIR Right 07/03/2013   Procedure: RECONSTRUCTION ANTERIOR CRUCIATE LIGAMENT (ACL) WITH HAMSTRING GRAFT;  Surgeon: Meredith Pel, MD;  Location: [place];  Service: Orthopedics;  Laterality: Right;  RIGHT KNEE ANTERIOR CRUCIATE LIGAMENT RECONSTRUCTION, POSTERIOR CRUCIATE LIGAMENT RECONSTRUCTION, MENISCAL  DEBRIDEMENT, HAMSTRING AUTOGRAFT.  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 Pt reports that heart lining of cow was then attached so that tissue would expand as pt grew. Dr. Glenna Fellows performed surgery.   . FRACTURE SURGERY  2010   nasal fracture  . KNEE SURGERY    . POSTERIOR CRUCIATE LIGAMENT RECONSTRUCTION Right 07/03/2013   Procedure: RECONSTRUCTION OF FIBULAR COLLATERAL LIGAMENT;  Surgeon: Meredith Pel, MD;  Location: [place];  Service: Orthopedics;  Laterality: Right;    Prior to Admission medications   Not on File    No current facility-administered medications for this encounter.    No current outpatient medications on file.    Allergies as of 11/22/2018 - Review Complete 11/22/2018  Allergen Reaction Noted  . Azithromycin Other (See Comments) 11/10/2018    Family History  Problem Relation Age of Onset  . Diabetes Mother   . Chiari malformation Mother     Social History   Socioeconomic History  . Marital status: Married    Spouse name: Not on file  . Number of children: Not on file  . Years of education: Not on file  . Highest education level: Not on file  Occupational History    Employer: [employer]  . Financial resource strain: Not on file  . Food insecurity    Worry: Not on file    Inability: Not on file  . Transportation needs    Medical: Not on file    Non-medical: Not on file  Tobacco Use  . Smoking status: Never Smoker  . Smokeless tobacco: Never Used  Substance and Sexual Activity  . Alcohol use: No  . Drug use: No  . Sexual activity: Not on file  Lifestyle  . Physical activity    Days per week: Not on file    Minutes per session: Not on file  . Stress: Not on file  Relationships  . Social Herbalist on phone: Not on file    Gets together: Not on file    Attends religious service: Not on file    Active member of club or organization: Not on file    Attends meetings of clubs or organizations: Not on file    Relationship status: Not on file  . Intimate partner violence    Fear of current or ex partner: Not on file    Emotionally abused: Not on file    Physically abused: Not on file    Forced sexual  activity: Not on file  Other Topics Concern  . Not on file  Social History Narrative   Married   2 daughters   [employer]    Review of Systems: All systems reviewed and negative except where noted in HPI.  Physical Exam: Vital signs in last 24 hours: Temp:  [99 F (37.2 C)] 99 F (37.2 C) (10/14 0938) Pulse Rate:  [114] 114 (10/14 0938) Resp:  [18] 18 (10/14 0938) BP: (165)/(125) 165/125 (10/14 0938) SpO2:  [100 %] 100 % (10/14 2637)   General:   Alert, well-developed,  male in NAD Psych:  Pleasant, cooperative. Normal mood and affect. Eyes:  Pupils equal, sclera clear, no icterus.   Conjunctiva pink.  Ears:  Normal auditory acuity. Nose:  No deformity, discharge,  or lesions. Neck:  Supple; no masses Lungs:  Clear throughout to auscultation.   No wheezes, crackles, or rhonchi.  Heart:  Regular rate and rhythm; no murmurs, no lower extremity edema Abdomen:  Soft, non-distended, nontender, BS active, no palp mass   Rectal:  Deferred  Msk:  Symmetrical without gross deformities. . Neurologic:  Alert and  oriented x4;  grossly normal neurologically. Skin:  Intact without significant lesions or rashes.   Intake/Output from previous day: No intake/output data recorded. Intake/Output this shift: No intake/output data recorded.  Lab Results: No results for input(s): WBC, HGB, HCT, PLT in the last 72 hours. BMET No results for input(s): NA, K, CL, CO2, GLUCOSE, BUN, CREATININE, CALCIUM in the last 72 hours. LFT No results for input(s): PROT, ALBUMIN, AST, ALT, ALKPHOS, BILITOT, BILIDIR, IBILI in the last 72 hours. PT/INR No results for input(s): LABPROT, INR in the last 72 hours. Hepatitis Panel No results for input(s): HEPBSAG, HCVAB, HEPAIGM, HEPBIGM in the last 72 hours.   . CBC Latest Ref Rng & Units 11/13/2018 11/10/2018 11/09/2018  WBC 4.0 - 10.5 K/uL 16.2(H) 16.9(H) 18.0(H)  Hemoglobin 13.0 - 17.0 g/dL 12.8(L) 13.0 13.0  Hematocrit 39.0 - 52.0 % 39.5  40.6 41.3  Platelets 150.0 - 400.0 K/uL 441.0(H) 243 253    . CMP Latest Ref Rng & Units 11/13/2018 11/13/2018 11/10/2018  Glucose 70 - 99 mg/dL - 128(H) 119(H)  BUN 6 - 23 mg/dL - 11 9  Creatinine 0.40 - 1.50 mg/dL - 0.85 0.57(L)  Sodium 135 - 145 mEq/L - 131(L) 134(L)  Potassium 3.5 - 5.1 mEq/L - 4.4 4.0  Chloride 96 - 112 mEq/L - 95(L) 101  CO2 19 - 32 mEq/L - 28 23  Calcium 8.4 - 10.5 mg/dL - 9.0 8.1(L)  Total Protein 6.0 - 8.3 g/dL 6.9 6.9 5.5(L)  Total Bilirubin 0.2 - 1.2 mg/dL 12.4(H) 12.4(H) 10.4(H)  Alkaline Phos 39 - 117 U/L 1,157(H) 1,157(H) 682(H)  AST 0 - 37 U/L 161(H) 161(H) 184(H)  ALT 0 - 53 U/L 258(H) 258(H) 227(H)   Studies/Results: No results found.    Tye Savoy, NP-C @  11/22/2018, 10:21 AM    ________________________________________________________________________  [facility] MD note:  I personally examined the patient, reviewed the data and agree with the assessment and plan described above.  He feels quite well other than being jaundiced. He is not encephalopathic.  Liver tests are not improving and in fact they are a bit worse.  He is being admitted to expedite work-up including MRI with MRCP and liver biopsy.  Likely to start empiric steroids after those tests.     Owens Loffler, MD [facility] Pager [phone]

## 2018-11-22 NOTE — H&P (Signed)
History and Physical  Gregory [name] EXH:[id number] DOB: 01/01/1991 DOA: 11/22/2018   Patient coming from: Home & is able to ambulate  Chief Complaint: Elevated liver enzymes  HPI: Gregory [name] is a 28 y.o. male with medical history significant for Chiari malformation, ADHD, bipolar disorder, presents to the ED due to worsening elevated liver enzymes of unknown etiology.  Patient was just discharged from the hospital on 11/10/2018 with elevated liver enzymes in addition to having fevers/cough.  Patient was finally discharged after multiple work-up had been done by [facility] and infectious disease.  Patient was felt to have an acute viral illness with possible drug-induced liver injury from heavy use of Tylenol daily and azithromycin.  Patient continued to follow-up with [facility] who noted worsening LFTs since discharge and advised patient to come to the ED.  Currently patient remains jaundiced, denies any abdominal pain, nausea/vomiting, diarrhea, fever/chills, cough, chest pain, shortness of breath.  Patient reports some dark-colored urine, with some pale stools intermittently.  ED Course: Patient vital signs remained stable, LFTs noted to be worsening with alkaline phosphatase at thousand 731, AST 171, ALT 346, T bili 16, thrombocytosis.  GI was consulted, recommend MRCP and possible liver biopsy.  [facility] asked to admit.  Review of Systems: Review of systems are otherwise negative   Past Medical History:  Diagnosis Date  . ADHD (attention deficit hyperactivity disorder)    denies taking meds  . Bipolar disorder ([facility])    Pt reports that this runs in his family but he has never been officially diagnosed. Pt reports that his mom used to give him Seroquel as a child but "made me feel like a zombie so I wouldn't take it".  . Headache(784.0)   . Pneumonia    Past Surgical History:  Procedure Laterality Date  . ANTERIOR CRUCIATE LIGAMENT REPAIR Right 07/03/2013   Procedure:  RECONSTRUCTION ANTERIOR CRUCIATE LIGAMENT (ACL) WITH HAMSTRING GRAFT;  Surgeon: [name], MD;  Location: [facility];  Service: Orthopedics;  Laterality: Right;  RIGHT KNEE ANTERIOR CRUCIATE LIGAMENT RECONSTRUCTION, POSTERIOR CRUCIATE LIGAMENT RECONSTRUCTION, MENISCAL  DEBRIDEMENT, HAMSTRING AUTOGRAFT.  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 Pt reports that heart lining of cow was then attached so that tissue would expand as pt grew. Dr. Trey Sailors performed surgery.  . FRACTURE SURGERY  2010   nasal fracture  . KNEE SURGERY    . POSTERIOR CRUCIATE LIGAMENT RECONSTRUCTION Right 07/03/2013   Procedure: RECONSTRUCTION OF FIBULAR COLLATERAL LIGAMENT;  Surgeon: [name], MD;  [facility];  Service: Orthopedics;  Laterality: Right;    Social History:  reports that he has never smoked. He has never used smokeless tobacco. He reports that he does not drink alcohol or use drugs.   Allergies  Allergen Reactions  . Azithromycin Other (See Comments)    Drug-induced hepatitis    Family History  Problem Relation Age of Onset  . Diabetes Mother   . Chiari malformation Mother       Prior to Admission medications   Not on File    Physical Exam: BP 116/82   Pulse 81   Temp 99 F (37.2 C) (Oral)   Resp 15   Ht 6' (1.829 m)   Wt 73.5 kg   SpO2 100%   BMI 21.97 kg/m   General: Jaundiced, NAD Eyes: Jaundiced ENT: Normal Neck: Supple Cardiovascular: S1, S2 present  Respiratory: CTA B Abdomen: Soft, nontender, nondistended, bowel sounds present Skin: Jaundiced Musculoskeletal: No pedal edema bilaterally Psychiatric: Normal mood Neurologic: No focal neurologic deficit noted          Labs on Admission:  Basic Metabolic Panel: Recent Labs  Lab 11/22/18 1230  NA 132*  K 3.7  CL 93*  CO2 27  GLUCOSE 101*  BUN 12  CREATININE 0.58*  CALCIUM 9.3   Liver Function Tests: Recent Labs   Lab 11/22/18 1230  AST 171*  ALT 346*  ALKPHOS 1,731*  BILITOT 16.0*  PROT 7.7  ALBUMIN 3.0*   Recent Labs  Lab 11/22/18 1230  LIPASE 20   No results for input(s): AMMONIA in the last 168 hours. CBC: Recent Labs  Lab 11/22/18 1230  WBC 8.8  NEUTROABS 4.8  HGB 12.2*  HCT 39.4  MCV 87.0  PLT 492*   Cardiac Enzymes: No results for input(s): CKTOTAL, CKMB, CKMBINDEX, TROPONINI in the last 168 hours.  BNP (last 3 results) No results for input(s): BNP in the last 8760 hours.  ProBNP (last 3 results) No results for input(s): PROBNP in the last 8760 hours.  CBG: No results for input(s): GLUCAP in the last 168 hours.  Radiological Exams on Admission: No results found.  EKG: Pending  Assessment/Plan Present on Admission: . Abnormal liver function . Elevated liver enzymes  Principal Problem:   Abnormal liver function Active Problems:   Elevated liver enzymes  Elevated liver enzymes of unknown etiology Worsening liver enzymes Multiple work-up has been done: Right upper quadrant ultrasound, HIDA scan, acute viral hepatitis all negative, mono negative, CMV less than 30, HIV negative, Tylenol level within normal limits.  Low copies of EBV noted, but negative IgM. [facility] on board, recommends MRCP, may need liver biopsy, after liver biopsy consider treating empirically with steroids for possible autoimmune liver disease MRCP pending Daily hepatic function panel, INR Monitor closely  Normocytic anemia/thrombocytosis Daily CBC      DVT prophylaxis: SCDs for now, pending procedures  Code Status: Full  Family Communication: None at bedside  Disposition Plan: Likely home  Consults called: GI on board  Admission status: Observation    Alma Friendly MD [facility]   If 7PM-7AM, please contact night-coverage [email or web address]   11/22/2018, 5:54 PM

## 2018-11-22 NOTE — Progress Notes (Signed)
Covid test back as negative, Airbone/Contact precautions discontinued as per protocol.

## 2018-11-23 ENCOUNTER — Observation Stay (HOSPITAL_COMMUNITY): Payer: Self-pay

## 2018-11-23 ENCOUNTER — Encounter (HOSPITAL_COMMUNITY): Payer: Self-pay | Admitting: Interventional Radiology

## 2018-11-23 DIAGNOSIS — R748 Abnormal levels of other serum enzymes: Secondary | ICD-10-CM

## 2018-11-23 DIAGNOSIS — R945 Abnormal results of liver function studies: Secondary | ICD-10-CM

## 2018-11-23 HISTORY — PX: IR US GUIDE BX ASP/DRAIN: IMG2392

## 2018-11-23 LAB — PROTIME-INR
INR: 1.1 (ref 0.8–1.2)
Prothrombin Time: 14 seconds (ref 11.4–15.2)

## 2018-11-23 LAB — COMPREHENSIVE METABOLIC PANEL
ALT: 262 U/L — ABNORMAL HIGH (ref 0–44)
AST: 140 U/L — ABNORMAL HIGH (ref 15–41)
Albumin: 2.5 g/dL — ABNORMAL LOW (ref 3.5–5.0)
Alkaline Phosphatase: 1433 U/L — ABNORMAL HIGH (ref 38–126)
Anion gap: 11 (ref 5–15)
BUN: 14 mg/dL (ref 6–20)
CO2: 24 mmol/L (ref 22–32)
Calcium: 9.1 mg/dL (ref 8.9–10.3)
Chloride: 97 mmol/L — ABNORMAL LOW (ref 98–111)
Creatinine, Ser: 0.68 mg/dL (ref 0.61–1.24)
GFR calc Af Amer: >60 mL/min (ref >60)
GFR calc non Af Amer: >60 mL/min (ref >60)
Glucose, Bld: 104 mg/dL — ABNORMAL HIGH (ref 70–99)
Potassium: 4.2 mmol/L (ref 3.5–5.1)
Sodium: 132 mmol/L — ABNORMAL LOW (ref 135–145)
Total Bilirubin: 15 mg/dL — ABNORMAL HIGH (ref 0.3–1.2)
Total Protein: 6.6 g/dL (ref 6.5–8.1)

## 2018-11-23 LAB — CBC
HCT: 35.5 % — ABNORMAL LOW (ref 39.0–52.0)
Hemoglobin: 10.9 g/dL — ABNORMAL LOW (ref 13.0–17.0)
MCH: 26.9 pg (ref 26.0–34.0)
MCHC: 30.7 g/dL (ref 30.0–36.0)
MCV: 87.7 fL (ref 80.0–100.0)
Platelets: 361 10*3/uL (ref 150–400)
RBC: 4.05 MIL/uL — ABNORMAL LOW (ref 4.22–5.81)
RDW: 16.3 % — ABNORMAL HIGH (ref 11.5–15.5)
WBC: 7.3 10*3/uL (ref 4.0–10.5)
nRBC: 0 % (ref 0.0–0.2)

## 2018-11-23 LAB — LACTATE DEHYDROGENASE: LDH: 125 U/L (ref 98–192)

## 2018-11-23 LAB — FERRITIN: Ferritin: 666 ng/mL — ABNORMAL HIGH (ref 24–336)

## 2018-11-23 MED ORDER — FENTANYL CITRATE (PF) 100 MCG/2ML IJ SOLN
INTRAMUSCULAR | Status: AC | PRN
  Administered 2018-11-23 (×2): 50 ug via INTRAVENOUS

## 2018-11-23 MED ORDER — LIDOCAINE HCL (PF) 1 % IJ SOLN
INTRAMUSCULAR | Status: AC | PRN
  Administered 2018-11-23: 10 mL

## 2018-11-23 MED ORDER — FENTANYL CITRATE (PF) 100 MCG/2ML IJ SOLN
INTRAMUSCULAR | Status: AC
  Filled 2018-11-23: qty 4

## 2018-11-23 MED ORDER — MIDAZOLAM HCL 2 MG/2ML IJ SOLN
INTRAMUSCULAR | Status: AC
  Filled 2018-11-23: qty 6

## 2018-11-23 MED ORDER — GELATIN ABSORBABLE 12-7 MM EX MISC
CUTANEOUS | Status: AC
  Administered 2018-11-23: 16:00:00
  Filled 2018-11-23: qty 1

## 2018-11-23 MED ORDER — MIDAZOLAM HCL 2 MG/2ML IJ SOLN
INTRAMUSCULAR | Status: AC | PRN
  Administered 2018-11-23 (×2): 1 mg via INTRAVENOUS

## 2018-11-23 MED ORDER — LIDOCAINE HCL 1 % IJ SOLN
INTRAMUSCULAR | Status: AC
  Filled 2018-11-23: qty 20

## 2018-11-23 NOTE — Consult Note (Signed)
Chief Complaint: Patient was seen in consultation today for abnormal LFTs/random liver biopsy.  Referring Physician(s): Gregory [name]  Supervising Physician: Gregory [name]  Patient Status: [name] - In-pt  History of Present Illness: Gregory [name] is a 28 y.o. male with a past medical history of headaches, pneumonia, ADHD, and bipolar disorder. He presented to [facility] ED 11/22/2018 for evaluation of progressive elevation of LFTs. He has been followed by [facility] for transaminitis, first diagnosed during admission 9/28/220 to 11/10/2018. Work-up during this admission unremarkable for causes of elevated LFTs. At his most recent GI visit, his LFT elevations were worsening, and was advised to head to ED for further evaluation. He was admitted for further management. He was seen by GI who recommends IR consultation for possible random renal biopsy.  US abdomen limited RUQ 11/06/2018: 1. The gallbladder wall appears thickened and subtly edematous. No gallstones evident. Question a degree of acalculus cholecystitis. This finding may warrant nuclear medicine hepatobiliary imaging study to assess for cystic duct patency. 2. Liver enlargement. The overall liver echogenicity is increased. There is an area of decreased echogenicity near the gallbladder fossa, probably representing localized fatty sparing. Beyond apparent fatty sparing, no focal liver lesions evident. It should be noted that the sensitivity of ultrasound for detection of focal liver lesions is somewhat diminished in this circumstance. 3. Splenomegaly.  No focal splenic lesions evident.  NM HIDA 11/07/2018: 1. Delayed/impaired transit of radiotracer from the liver into the bowel. Differential would include hepatic dysfunction (such as hepatitis) versus biliary obstruction. Consider MRCP for further evaluation. 2. Gallbladder filling cannot be assessed with out a excretion of radiotracer into extrahepatic ducts.  MR abdomen MRCP  11/22/2018: 1. No morphologic changes of hepatic cirrhosis. No suspicious liver lesions. 2. Normal gallbladder and bile ducts. 3. Moderate splenomegaly.  No abdominal adenopathy.  IR consulted by Gregory Cluster, NP for possible image-guided random liver biopsy. Patient awake and alert laying in bed watching TV with no complaints at this time. Denies fever, chills, chest pain, dyspnea, abdominal pain, or headache.   Past Medical History:  Diagnosis Date   ADHD (attention deficit hyperactivity disorder)    denies taking meds   Bipolar disorder (HCC)    Pt reports that this runs in his family but he has never been officially diagnosed. Pt reports that his mom used to give him Seroquel as a child but "made me feel like a zombie so I wouldn't take it".   Headache(784.0)    Pneumonia     Past Surgical History:  Procedure Laterality Date   ANTERIOR CRUCIATE LIGAMENT REPAIR Right 07/03/2013   Procedure: RECONSTRUCTION ANTERIOR CRUCIATE LIGAMENT (ACL) WITH HAMSTRING GRAFT;  Surgeon: Gregory Copa, MD;  Location: [facility] OR;  Service: Orthopedics;  Laterality: Right;  RIGHT KNEE ANTERIOR CRUCIATE LIGAMENT RECONSTRUCTION, POSTERIOR CRUCIATE LIGAMENT RECONSTRUCTION, MENISCAL  DEBRIDEMENT, HAMSTRING AUTOGRAFT.   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 Pt reports that heart lining of cow was then attached so that tissue would expand as pt grew. Dr. Trey [name] performed surgery.   FRACTURE SURGERY  2010   nasal fracture   KNEE SURGERY     POSTERIOR CRUCIATE LIGAMENT RECONSTRUCTION Right 07/03/2013   Procedure: RECONSTRUCTION OF FIBULAR COLLATERAL LIGAMENT;  Surgeon: Gregory Copa, MD;  Location: [facility] OR;  Service: Orthopedics;  Laterality: Right;    Allergies: Azithromycin  Medications: Prior to Admission medications  Not on File     Family History  Problem Relation Age of Onset   Diabetes Mother      Chiari malformation Mother     Social History   Socioeconomic History   Marital status: Married    Spouse name: Not on file   Number of children: Not on file   Years of education: Not on file   Highest education level: Not on file  Occupational History    Employer: [employer] resource strain: Not on file   Food insecurity    Worry: Not on file    Inability: Not on file   Transportation needs    Medical: Not on file    Non-medical: Not on file  Tobacco Use   Smoking status: Never Smoker   Smokeless tobacco: Never Used  Substance and Sexual Activity   Alcohol use: No   Drug use: No   Sexual activity: Not on file  Lifestyle   Physical activity    Days per week: Not on file    Minutes per session: Not on file   Stress: Not on file  Relationships   Social connections    Talks on phone: Not on file    Gets together: Not on file    Attends religious service: Not on file    Active member of club or organization: Not on file    Attends meetings of clubs or organizations: Not on file    Relationship status: Not on file  Other Topics Concern   Not on file  Social History Narrative   Married   2 daughters   [employer]     Review of Systems: A 12 point ROS discussed and pertinent positives are indicated in the HPI above.  All other systems are negative.  Review of Systems  Constitutional: Negative for chills and fever.  Respiratory: Negative for shortness of breath and wheezing.   Cardiovascular: Negative for chest pain and palpitations.  Gastrointestinal: Negative for abdominal pain.  Neurological: Negative for headaches.  Psychiatric/Behavioral: Negative for behavioral problems and confusion.    Vital Signs: BP 125/82 (BP Location: Left Arm)    Pulse 71    Temp 97.7 F (36.5 C)    Resp 17    Ht 6' (1.829 m)    Wt 162 lb (73.5 kg)    SpO2 100%    BMI 21.97 kg/m   Physical Exam Vitals signs and nursing  note reviewed.  Constitutional:      General: He is not in acute distress.    Appearance: Normal appearance.  Cardiovascular:     Rate and Rhythm: Normal rate and regular rhythm.     Heart sounds: Normal heart sounds. No murmur.  Pulmonary:     Effort: Pulmonary effort is normal. No respiratory distress.     Breath sounds: Normal breath sounds. No wheezing.  Skin:    General: Skin is warm and dry.     Coloration: Skin is jaundiced.  Neurological:     Mental Status: He is alert and oriented to person, place, and time.  Psychiatric:        Mood and Affect: Mood normal.        Behavior: Behavior normal.        Thought Content: Thought content normal.        Judgment: Judgment normal.      MD Evaluation Airway: WNL Heart: WNL Abdomen: WNL Chest/ Lungs: WNL ASA  Classification:  2 Mallampati/Airway Score: One   Imaging: Ct Angio Chest Pe W Or Wo Contrast  Result Date: 11/06/2018 CLINICAL DATA:  Cough and congestion for 7 days, on antibiotics, fever, dyspnea, PE suspected with positive D-dimer EXAM: CT ANGIOGRAPHY CHEST WITH CONTRAST TECHNIQUE: Multidetector CT imaging of the chest was performed using the standard protocol during bolus administration of intravenous contrast. Multiplanar CT image reconstructions and MIPs were obtained to evaluate the vascular anatomy. CONTRAST:  100mL OMNIPAQUE IOHEXOL 350 MG/ML SOLN COMPARISON:  Chest radiograph 11/06/2018 FINDINGS: Cardiovascular: Satisfactory opacification of the pulmonary arteries to the segmental level. No evidence of pulmonary embolism. No central pulmonary artery enlargement. No CT evidence of right heart strain. Normal heart size. No pericardial effusion. The aorta is normal caliber. No luminal irregularity or periaortic stranding. Common origin of the brachiocephalic and left common carotid artery. Proximal great vessels and subclavian arteries are unremarkable. Mediastinum/Nodes: No enlarged mediastinal or axillary lymph nodes.  Thyroid gland, trachea, and esophagus demonstrate no significant findings. Lungs/Pleura: Dependent atelectasis is noted posteriorly. More bandlike areas of atelectasis or scarring are present in the bases. No consolidation, features of edema, pneumothorax, or effusion. No suspicious pulmonary nodules or masses. Upper Abdomen: No acute abnormalities present in the visualized portions of the upper abdomen. Musculoskeletal: No acute osseous abnormality or suspicious osseous lesion. Mild bilateral gynecomastia. No suspicious chest wall lesions. Review of the MIP images confirms the above findings. IMPRESSION: No evidence of pulmonary embolism. No acute intrathoracic process. Electronically Signed   By: Kreg ShropshirePrice  DeHay M.D.   On: 11/06/2018 23:43   Nm Hepatobiliary Liver Func  Result Date: 11/08/2018 CLINICAL DATA:  Elevated bilirubin. Mild gallbladder wall thickening on ultrasound. EXAM: NUCLEAR MEDICINE HEPATOBILIARY IMAGING TECHNIQUE: Sequential images of the abdomen were obtained out to 60 minutes following intravenous administration of radiopharmaceutical. RADIOPHARMACEUTICALS:  7.5 mCi Tc-2023m  Choletec IV COMPARISON:  Ultrasound 11/06/2018 FINDINGS: Delayed clearance of radiotracer from the blood pool related to elevated bilirubin. There is uniform uptake within the liver. Imaging performed over 90 minutes with no transit of radiotracer into the bowel. Gallbladder filling cannot be assessed. IMPRESSION: Delayed/impaired transit of radiotracer from the liver into the bowel. Differential would include hepatic dysfunction (such as hepatitis) versus biliary obstruction. Consider MRCP for further evaluation. Gallbladder filling cannot be assessed with out a excretion of radiotracer into extrahepatic ducts. Image performed on 11/07/2018 at 11 a.m. Images not presented for interpretation until 11/08/2018 at 9 a.m. Multimedia programmerComputer interface technical difficulty. Findings conveyed to [place], [place] on 11/08/2018  at09:09.  Electronically Signed   By: Genevive BiStewart  Edmunds M.D.   On: 11/08/2018 09:09   Mr 3d Recon At Scanner  Result Date: 11/22/2018 CLINICAL DATA:  Inpatient. Abnormal liver function tests. Hyperbilirubinemia. Elevated liver enzymes. EXAM: MRI ABDOMEN WITHOUT AND WITH CONTRAST (INCLUDING MRCP) TECHNIQUE: Multiplanar multisequence MR imaging of the abdomen was performed both before and after the administration of intravenous contrast. Heavily T2-weighted images of the biliary and pancreatic ducts were obtained, and three-dimensional MRCP images were rendered by post processing. CONTRAST:  7.705mL GADAVIST GADOBUTROL 1 MMOL/ML IV SOLN COMPARISON:  11/08/2018 a HIDA scan. 11/06/2018 abdominal sonogram. FINDINGS: Lower chest: No acute abnormality at the lung bases. Hepatobiliary: Normal liver size and configuration. No significant hepatic steatosis. Subcentimeter simple segment 4A left liver lobe cyst. No additional liver lesions. Normal gallbladder with no cholelithiasis. No biliary ductal dilatation. Common bile duct diameter 3 mm. No biliary filling defects, strictures or beading. Pancreas: No pancreatic mass or duct dilation.  No pancreas divisum. Spleen:  Moderate splenomegaly. Craniocaudal splenic length 17.2 cm no splenic mass. Adrenals/Urinary Tract: Normal adrenals. No hydronephrosis. Normal kidneys with no renal mass. Stomach/Bowel: Normal non-distended stomach. Visualized small and large bowel is normal caliber, with no bowel wall thickening. Vascular/Lymphatic: Normal caliber abdominal aorta. Patent portal, splenic, hepatic and renal veins. No pathologically enlarged lymph nodes in the abdomen. Other: No abdominal ascites or focal fluid collection. Musculoskeletal: No aggressive appearing focal osseous lesions. IMPRESSION: 1. No morphologic changes of hepatic cirrhosis. No suspicious liver lesions. 2. Normal gallbladder and bile ducts. 3. Moderate splenomegaly.  No abdominal adenopathy. Electronically Signed   By:  Delbert Phenix M.D.   On: 11/22/2018 20:06   Dg Chest Portable 1 View  Result Date: 11/06/2018 CLINICAL DATA:  Cough and fever EXAM: PORTABLE CHEST 1 VIEW COMPARISON:  None. FINDINGS: The heart size and mediastinal contours are within normal limits. Both lungs are clear. The visualized skeletal structures are unremarkable. IMPRESSION: No active disease. Electronically Signed   By: Alcide Clever M.D.   On: 11/06/2018 08:50   Mr Abdomen Mrcp Vivien Rossetti Contast  Result Date: 11/22/2018 CLINICAL DATA:  Inpatient. Abnormal liver function tests. Hyperbilirubinemia. Elevated liver enzymes. EXAM: MRI ABDOMEN WITHOUT AND WITH CONTRAST (INCLUDING MRCP) TECHNIQUE: Multiplanar multisequence MR imaging of the abdomen was performed both before and after the administration of intravenous contrast. Heavily T2-weighted images of the biliary and pancreatic ducts were obtained, and three-dimensional MRCP images were rendered by post processing. CONTRAST:  7.74mL GADAVIST GADOBUTROL 1 MMOL/ML IV SOLN COMPARISON:  11/08/2018 a HIDA scan. 11/06/2018 abdominal sonogram. FINDINGS: Lower chest: No acute abnormality at the lung bases. Hepatobiliary: Normal liver size and configuration. No significant hepatic steatosis. Subcentimeter simple segment 4A left liver lobe cyst. No additional liver lesions. Normal gallbladder with no cholelithiasis. No biliary ductal dilatation. Common bile duct diameter 3 mm. No biliary filling defects, strictures or beading. Pancreas: No pancreatic mass or duct dilation.  No pancreas divisum. Spleen: Moderate splenomegaly. Craniocaudal splenic length 17.2 cm no splenic mass. Adrenals/Urinary Tract: Normal adrenals. No hydronephrosis. Normal kidneys with no renal mass. Stomach/Bowel: Normal non-distended stomach. Visualized small and large bowel is normal caliber, with no bowel wall thickening. Vascular/Lymphatic: Normal caliber abdominal aorta. Patent portal, splenic, hepatic and renal veins. No pathologically  enlarged lymph nodes in the abdomen. Other: No abdominal ascites or focal fluid collection. Musculoskeletal: No aggressive appearing focal osseous lesions. IMPRESSION: 1. No morphologic changes of hepatic cirrhosis. No suspicious liver lesions. 2. Normal gallbladder and bile ducts. 3. Moderate splenomegaly.  No abdominal adenopathy. Electronically Signed   By: Delbert Phenix M.D.   On: 11/22/2018 20:06   US Abdomen Limited Ruq  Result Date: 11/06/2018 CLINICAL DATA:  Upper abdominal pain with nausea and vomiting. Fever. EXAM: ULTRASOUND ABDOMEN LIMITED RIGHT UPPER QUADRANT COMPARISON:  None. FINDINGS: Gallbladder: No gallstones are evident. The gallbladder wall appears thickened and subtly edematous. No pericholecystic fluid evident. No sonographic Murphy sign noted by sonographer. Common bile duct: Diameter: 4 mm. No intrahepatic or extrahepatic biliary duct dilatation. Liver: No focal lesion identified. Liver echogenicity is overall increased. There is an area of suspected fatty sparing near the gallbladder fossa. Liver measures approximately 20 cm in length. Portal vein is patent on color Doppler imaging with normal direction of blood flow towards the liver. Other: Spleen measures 17.2 x 8.7 x 16.9 cm with a measured splenic volume of 1,325 cubic cm. No focal splenic lesions evident. IMPRESSION: 1. The gallbladder wall appears thickened and subtly edematous. No gallstones evident. Question  a degree of acalculus cholecystitis. This finding may warrant nuclear medicine hepatobiliary imaging study to assess for cystic duct patency. 2. Liver enlargement. The overall liver echogenicity is increased. There is an area of decreased echogenicity near the gallbladder fossa, probably representing localized fatty sparing. Beyond apparent fatty sparing, no focal liver lesions evident. It should be noted that the sensitivity of ultrasound for detection of focal liver lesions is somewhat diminished in this circumstance. 3.   Splenomegaly.  No focal splenic lesions evident. Electronically Signed   By: Bretta Bang III M.D.   On: 11/06/2018 10:49    Labs:  CBC: Recent Labs    11/10/18 0403 11/13/18 0901 11/22/18 1230 11/23/18 0420  WBC 16.9* 16.2* 8.8 7.3  HGB 13.0 12.8* 12.2* 10.9*  HCT 40.6 39.5 39.4 35.5*  PLT 243 441.0* 492* 361    COAGS: Recent Labs    11/07/18 0404  11/10/18 0403 11/13/18 0901 11/22/18 1230 11/23/18 0420  INR 1.9*   < > 1.2 1.4* 1.0 1.1  APTT 32  --   --   --   --   --    < > = values in this interval not displayed.    BMP: Recent Labs    11/09/18 0425 11/10/18 0403 11/13/18 0901 11/22/18 1230 11/23/18 0420  NA 131* 134* 131* 132* 132*  K 4.2 4.0 4.4 3.7 4.2  CL 98 101 95* 93* 97*  CO2 GLUCOSE 129* 119* 128* 101* 104*  BUN CALCIUM 8.0* 8.1* 9.0 9.3 9.1  CREATININE 0.69 0.57* 0.85 0.58* 0.68  GFRNONAA >60 >60  --  >60 >60  GFRAA >60 >60  --  >60 >60    LIVER FUNCTION TESTS: Recent Labs    11/10/18 0403 11/13/18 0901 11/22/18 1230 11/23/18 0420  BILITOT 10.4* 12.4*   12.4* 16.0* 15.0*  AST 184* 161*   161* 171* 140*  ALT 227* 258*   258* 346* 262*  ALKPHOS 682* 1,157*   1,157* 1,731* 1,433*  PROT 5.5* 6.9   6.9 7.7 6.6  ALBUMIN 2.0* 3.1*   3.1* 3.0* 2.5*     Assessment and Plan:  Abnormal LFTs. Plan for image-guided random liver biopsy tentatively for today in IR. Patient is NPO. Afebrile. He does not take blood thinners. INR 1.1 today.  Risks and benefits discussed with the patient including, but not limited to bleeding, infection, damage to adjacent structures or low yield requiring additional tests. All of the patient's questions were answered, patient is agreeable to proceed. Consent signed and in chart.   Thank you for this interesting consult.  I greatly enjoyed meeting Gregory [name] and look forward to participating in their care.  A copy of this report was sent to the requesting provider on  this date.  Electronically Signed: Elwin Mocha, PA-C 11/23/2018, 1:56 PM   I spent a total of 40 Minutes in face to face in clinical consultation, greater than 50% of which was counseling/coordinating care for abnormal LFTs/random liver biopsy.

## 2018-11-23 NOTE — Discharge Instructions (Signed)
Please follow-up with your outpatient gastroenterologist.

## 2018-11-23 NOTE — Discharge Summary (Signed)
Physician Discharge Summary  Gregory [name]:[id number] DOB: 1990/09/15 DOA: 11/22/2018  PCP: Patient, No Pcp Per  Admit date: 11/22/2018 Discharge date: 11/23/2018  Admitted From: Home Disposition: Home  Recommendations for Outpatient Follow-up:  1. Follow up with GI for biopsy results 2. Please follow up on the following pending results: Liver biopsy results  Home Health: None Equipment/Devices: None  Discharge Condition: Stable CODE STATUS: Full code Diet recommendation: Regular diet   Brief/Interim Summary:  Admission HPI written by Briant Cedar, MD   Chief Complaint: Elevated liver enzymes  HPI: Gregory [name] is a 28 y.o. male with medical history significant for Chiari malformation, ADHD, bipolar disorder, presents to the ED due to worsening elevated liver enzymes of unknown etiology.  Patient was just discharged from the hospital on 11/10/2018 with elevated liver enzymes in addition to having fevers/cough.  Patient was finally discharged after multiple work-up had been done by [facility] and infectious disease.  Patient was felt to have an acute viral illness with possible drug-induced liver injury from heavy use of Tylenol daily and azithromycin.  Patient continued to follow-up with [facility] who noted worsening LFTs since discharge and advised patient to come to the ED.  Currently patient remains jaundiced, denies any abdominal pain, nausea/vomiting, diarrhea, fever/chills, cough, chest pain, shortness of breath.  Patient reports some dark-colored urine, with some pale stools intermittently.  ED Course: Patient vital signs remained stable, LFTs noted to be worsening with alkaline phosphatase at thousand 731, AST 171, ALT 346, T bili 16, thrombocytosis.  GI was consulted, recommend MRCP and possible liver biopsy.     Hospital course:  Elevated liver enzymes Unknown etiology. Significant workup prior to admission. Alkaline phosphatase, AST, ALT,  bilirubin of 1731, 171, 346, 16 respectively on admission and all trended down prior to admission.GI consulted and recommended liver biopsy.   Discharge Diagnoses:  Principal Problem:   Abnormal liver function Active Problems:   Elevated liver enzymes    Discharge Instructions   Allergies as of 11/23/2018      Reactions   Azithromycin Other (See Comments)   Drug-induced hepatitis      Medication List    You have not been prescribed any medications.    Follow-up Information    Pyrtle, Carie Caddy, MD. Schedule an appointment as soon as possible for a visit.   Specialty: Gastroenterology Contact information: [address] [place] [address] [phone]          Allergies  Allergen Reactions   Azithromycin Other (See Comments)    Drug-induced hepatitis    Consultations:  [facility]   Procedures/Studies: Ct Angio Chest Pe W Or Wo Contrast  Result Date: 11/06/2018 CLINICAL DATA:  Cough and congestion for 7 days, on antibiotics, fever, dyspnea, PE suspected with positive D-dimer EXAM: CT ANGIOGRAPHY CHEST WITH CONTRAST TECHNIQUE: Multidetector CT imaging of the chest was performed using the standard protocol during bolus administration of intravenous contrast. Multiplanar CT image reconstructions and MIPs were obtained to evaluate the vascular anatomy. CONTRAST:  OMNIPAQUE IOHEXOL 350 MG/ML SOLN COMPARISON:  Chest radiograph 11/06/2018 FINDINGS: Cardiovascular: Satisfactory opacification of the pulmonary arteries to the segmental level. No evidence of pulmonary embolism. No central pulmonary artery enlargement. No CT evidence of right heart strain. Normal heart size. No pericardial effusion. The aorta is normal caliber. No luminal irregularity or periaortic stranding. Common origin of the brachiocephalic and left common carotid artery. Proximal great vessels and subclavian arteries are unremarkable. Mediastinum/Nodes: No  enlarged mediastinal or axillary lymph  nodes. Thyroid gland, trachea, and esophagus demonstrate no significant findings. Lungs/Pleura: Dependent atelectasis is noted posteriorly. More bandlike areas of atelectasis or scarring are present in the bases. No consolidation, features of edema, pneumothorax, or effusion. No suspicious pulmonary nodules or masses. Upper Abdomen: No acute abnormalities present in the visualized portions of the upper abdomen. Musculoskeletal: No acute osseous abnormality or suspicious osseous lesion. Mild bilateral gynecomastia. No suspicious chest wall lesions. Review of the MIP images confirms the above findings. IMPRESSION: No evidence of pulmonary embolism. No acute intrathoracic process. Electronically Signed   By: Kreg Shropshire M.D.   On: 11/06/2018 23:43   Nm Hepatobiliary Liver Func  Result Date: 11/08/2018 CLINICAL DATA:  Elevated bilirubin. Mild gallbladder wall thickening on ultrasound. EXAM: NUCLEAR MEDICINE HEPATOBILIARY IMAGING TECHNIQUE: Sequential images of the abdomen were obtained out to 60 minutes following intravenous administration of radiopharmaceutical. RADIOPHARMACEUTICALS:  7.5 mCi Tc-78m  Choletec IV COMPARISON:  Ultrasound 11/06/2018 FINDINGS: Delayed clearance of radiotracer from the blood pool related to elevated bilirubin. There is uniform uptake within the liver. Imaging performed over 90 minutes with no transit of radiotracer into the bowel. Gallbladder filling cannot be assessed. IMPRESSION: Delayed/impaired transit of radiotracer from the liver into the bowel. Differential would include hepatic dysfunction (such as hepatitis) versus biliary obstruction. Consider MRCP for further evaluation. Gallbladder filling cannot be assessed with out a excretion of radiotracer into extrahepatic ducts. Image performed on 11/07/2018 at 11 a.m. Images not presented for interpretation until 11/08/2018 at 9 a.m. Multimedia programmer difficulty. Findings conveyed to [place], [place] on 11/08/2018  at09:09.  Electronically Signed   By: Genevive Bi M.D.   On: 11/08/2018 09:09   Mr 3d Recon At Scanner  Result Date: 11/22/2018 CLINICAL DATA:  Inpatient. Abnormal liver function tests. Hyperbilirubinemia. Elevated liver enzymes. EXAM: MRI ABDOMEN WITHOUT AND WITH CONTRAST (INCLUDING MRCP) TECHNIQUE: Multiplanar multisequence MR imaging of the abdomen was performed both before and after the administration of intravenous contrast. Heavily T2-weighted images of the biliary and pancreatic ducts were obtained, and three-dimensional MRCP images were rendered by post processing. CONTRAST:  7.88mL GADAVIST GADOBUTROL 1 MMOL/ML IV SOLN COMPARISON:  11/08/2018 a HIDA scan. 11/06/2018 abdominal sonogram. FINDINGS: Lower chest: No acute abnormality at the lung bases. Hepatobiliary: Normal liver size and configuration. No significant hepatic steatosis. Subcentimeter simple segment 4A left liver lobe cyst. No additional liver lesions. Normal gallbladder with no cholelithiasis. No biliary ductal dilatation. Common bile duct diameter 3 mm. No biliary filling defects, strictures or beading. Pancreas: No pancreatic mass or duct dilation.  No pancreas divisum. Spleen: Moderate splenomegaly. Craniocaudal splenic length 17.2 cm no splenic mass. Adrenals/Urinary Tract: Normal adrenals. No hydronephrosis. Normal kidneys with no renal mass. Stomach/Bowel: Normal non-distended stomach. Visualized small and large bowel is normal caliber, with no bowel wall thickening. Vascular/Lymphatic: Normal caliber abdominal aorta. Patent portal, splenic, hepatic and renal veins. No pathologically enlarged lymph nodes in the abdomen. Other: No abdominal ascites or focal fluid collection. Musculoskeletal: No aggressive appearing focal osseous lesions. IMPRESSION: 1. No morphologic changes of hepatic cirrhosis. No suspicious liver lesions. 2. Normal gallbladder and bile ducts. 3. Moderate splenomegaly.  No abdominal adenopathy. Electronically Signed   By:  Delbert Phenix M.D.   On: 11/22/2018 20:06   Ir US Guide Bx Asp/drain  Result Date: 11/23/2018 CLINICAL DATA:  Abnormal liver function tests and possible autoimmune hepatitis. Random liver biopsy requested. EXAM: ULTRASOUND GUIDED CORE BIOPSY OF BODY PART MEDICATIONS: 2.0 mg IV Versed; 100  mcg IV Fentanyl Total Moderate Sedation Time: 10 minutes. The patient's level of consciousness and physiologic status were continuously monitored during the procedure by Radiology nursing. PROCEDURE: The procedure, risks, benefits, and alternatives were explained to the patient. Questions regarding the procedure were encouraged and answered. The patient understands and consents to the procedure. A time out was performed prior to initiating the procedure. Ultrasound was used to localize the liver. The abdominal wall was prepped with chlorhexidine in a sterile fashion, and a sterile drape was applied covering the operative field. A sterile gown and sterile gloves were used for the procedure. Local anesthesia was provided with 1% Lidocaine. A 17 gauge trocar needle was advanced into the right lobe of the liver. After confirming needle tip position, coaxial 18 gauge core biopsy samples were obtained. A total of 3 samples were submitted in formalin. After the procedure, Gel-Foam pledgets were advanced through the outer needle as it was removed. COMPLICATIONS: None. FINDINGS: Solid samples were obtained from the liver parenchyma. There were no immediate complications. IMPRESSION: Ultrasound-guided core biopsy performed of the liver within the right lobe parenchyma. Electronically Signed   By: Irish LackGlenn  Yamagata M.D.   On: 11/23/2018 18:01   Dg Chest Portable 1 View  Result Date: 11/06/2018 CLINICAL DATA:  Cough and fever EXAM: PORTABLE CHEST 1 VIEW COMPARISON:  None. FINDINGS: The heart size and mediastinal contours are within normal limits. Both lungs are clear. The visualized skeletal structures are unremarkable. IMPRESSION: No  active disease. Electronically Signed   By: Alcide CleverMark  Lukens M.D.   On: 11/06/2018 08:50   Mr Abdomen Mrcp Vivien RossettiW Wo Contast  Result Date: 11/22/2018 CLINICAL DATA:  Inpatient. Abnormal liver function tests. Hyperbilirubinemia. Elevated liver enzymes. EXAM: MRI ABDOMEN WITHOUT AND WITH CONTRAST (INCLUDING MRCP) TECHNIQUE: Multiplanar multisequence MR imaging of the abdomen was performed both before and after the administration of intravenous contrast. Heavily T2-weighted images of the biliary and pancreatic ducts were obtained, and three-dimensional MRCP images were rendered by post processing. CONTRAST:  7.305mL GADAVIST GADOBUTROL 1 MMOL/ML IV SOLN COMPARISON:  11/08/2018 a HIDA scan. 11/06/2018 abdominal sonogram. FINDINGS: Lower chest: No acute abnormality at the lung bases. Hepatobiliary: Normal liver size and configuration. No significant hepatic steatosis. Subcentimeter simple segment 4A left liver lobe cyst. No additional liver lesions. Normal gallbladder with no cholelithiasis. No biliary ductal dilatation. Common bile duct diameter 3 mm. No biliary filling defects, strictures or beading. Pancreas: No pancreatic mass or duct dilation.  No pancreas divisum. Spleen: Moderate splenomegaly. Craniocaudal splenic length 17.2 cm no splenic mass. Adrenals/Urinary Tract: Normal adrenals. No hydronephrosis. Normal kidneys with no renal mass. Stomach/Bowel: Normal non-distended stomach. Visualized small and large bowel is normal caliber, with no bowel wall thickening. Vascular/Lymphatic: Normal caliber abdominal aorta. Patent portal, splenic, hepatic and renal veins. No pathologically enlarged lymph nodes in the abdomen. Other: No abdominal ascites or focal fluid collection. Musculoskeletal: No aggressive appearing focal osseous lesions. IMPRESSION: 1. No morphologic changes of hepatic cirrhosis. No suspicious liver lesions. 2. Normal gallbladder and bile ducts. 3. Moderate splenomegaly.  No abdominal adenopathy.  Electronically Signed   By: Delbert PhenixJason A Poff M.D.   On: 11/22/2018 20:06   [place] Abdomen Limited Ruq  Result Date: 11/06/2018 CLINICAL DATA:  Upper abdominal pain with nausea and vomiting. Fever. EXAM: ULTRASOUND ABDOMEN LIMITED RIGHT UPPER QUADRANT COMPARISON:  None. FINDINGS: Gallbladder: No gallstones are evident. The gallbladder wall appears thickened and subtly edematous. No pericholecystic fluid evident. No sonographic Murphy sign noted by sonographer. Common bile duct: Diameter: 4  mm. No intrahepatic or extrahepatic biliary duct dilatation. Liver: No focal lesion identified. Liver echogenicity is overall increased. There is an area of suspected fatty sparing near the gallbladder fossa. Liver measures approximately 20 cm in length. Portal vein is patent on color Doppler imaging with normal direction of blood flow towards the liver. Other: Spleen measures 17.2 x 8.7 x 16.9 cm with a measured splenic volume of 1,325 cubic cm. No focal splenic lesions evident. IMPRESSION: 1. The gallbladder wall appears thickened and subtly edematous. No gallstones evident. Question a degree of acalculus cholecystitis. This finding may warrant nuclear medicine hepatobiliary imaging study to assess for cystic duct patency. 2. Liver enlargement. The overall liver echogenicity is increased. There is an area of decreased echogenicity near the gallbladder fossa, probably representing localized fatty sparing. Beyond apparent fatty sparing, no focal liver lesions evident. It should be noted that the sensitivity of ultrasound for detection of focal liver lesions is somewhat diminished in this circumstance. 3.  Splenomegaly.  No focal splenic lesions evident. Electronically Signed   By: Bretta Bang III M.D.   On: 11/06/2018 10:49     Subjective: No issues. Feels well. No abdominal pain.  Discharge Exam: Vitals:   11/23/18 1707 11/23/18 1759  BP: 135/90 116/81  Pulse: 63 74  Resp: 16 16  Temp: 98.6 F (37 C) 98.6 F (37  C)  SpO2: 100% 100%   Vitals:   11/23/18 1615 11/23/18 1620 11/23/18 1707 11/23/18 1759  BP: 123/87 133/89 135/90 116/81  Pulse: 75 68 63 74  Resp: Temp:   98.6 F (37 C) 98.6 F (37 C)  TempSrc:   Oral Oral  SpO2: 100% 100% 100% 100%  Weight:      Height:        General: Pt is alert, awake, not in acute distress Cardiovascular: RRR, S1/S2 +, no rubs, no gallops Respiratory: CTA bilaterally, no wheezing, no rhonchi Abdominal: Soft, NT, ND, bowel sounds + Extremities: no edema, no cyanosis Skin: Jaundiced    The results of significant diagnostics from this hospitalization (including imaging, microbiology, ancillary and laboratory) are listed below for reference.     Microbiology: Recent Results (from the past 240 hour(s))  SARS Coronavirus 2 by RT PCR (hospital order, performed in [facility] hospital lab) Nasopharyngeal Nasopharyngeal Swab     Status: None   Collection Time: 11/22/18  3:38 PM   Specimen: Nasopharyngeal Swab  Result Value Ref Range Status   SARS Coronavirus 2 NEGATIVE NEGATIVE Final    Comment: (NOTE) If result is NEGATIVE SARS-CoV-2 target nucleic acids are NOT DETECTED. The SARS-CoV-2 RNA is generally detectable in upper and lower  respiratory specimens during the acute phase of infection. The lowest  concentration of SARS-CoV-2 viral copies this assay can detect is 250  copies / mL. A negative result does not preclude SARS-CoV-2 infection  and should not be used as the sole basis for treatment or other  patient management decisions.  A negative result may occur with  improper specimen collection / handling, submission of specimen other  than nasopharyngeal swab, presence of viral mutation(s) within the  areas targeted by this assay, and inadequate number of viral copies  (<250 copies / mL). A negative result must be combined with clinical  observations, patient history, and epidemiological information. If result is  POSITIVE SARS-CoV-2 target nucleic acids are DETECTED. The SARS-CoV-2 RNA is generally detectable in upper and lower  respiratory specimens dur ing the acute phase of  infection.  Positive  results are indicative of active infection with SARS-CoV-2.  Clinical  correlation with patient history and other diagnostic information is  necessary to determine patient infection status.  Positive results do  not rule out bacterial infection or co-infection with other viruses. If result is PRESUMPTIVE POSTIVE SARS-CoV-2 nucleic acids MAY BE PRESENT.   A presumptive positive result was obtained on the submitted specimen  and confirmed on repeat testing.  While 2019 novel coronavirus  (SARS-CoV-2) nucleic acids may be present in the submitted sample  additional confirmatory testing may be necessary for epidemiological  and / or clinical management purposes  to differentiate between  SARS-CoV-2 and other Sarbecovirus currently known to infect humans.  If clinically indicated additional testing with an alternate test  methodology [id number]) is advised. The SARS-CoV-2 RNA is generally  detectable in upper and lower respiratory sp ecimens during the acute  phase of infection. The expected result is Negative. Fact Sheet for Patients:  BoilerBrush.com.cy Fact Sheet for Healthcare Providers: https://pope.com/ This test is not yet approved or cleared by the [place] FDA and has been authorized for detection and/or diagnosis of SARS-CoV-2 by FDA under an Emergency Use Authorization (EUA).  This EUA will remain in effect (meaning this test can be used) for the duration of the COVID-19 declaration under Section 564(b)(1) of the Act, 21 U.S.C. section 360bbb-3(b)(1), unless the authorization is terminated or revoked sooner. Performed at [facility], [address], [place], [place] [address]      Labs: BNP (last 3 results) No  results for input(s): BNP in the last 8760 hours. Basic Metabolic Panel: Recent Labs  Lab 11/22/18 1230 11/23/18 0420  NA 132* 132*  K 3.7 4.2  CL 93* 97*  CO2 27 24  GLUCOSE 101* 104*  BUN 12 14  CREATININE 0.58* 0.68  CALCIUM 9.3 9.1   Liver Function Tests: Recent Labs  Lab 11/22/18 1230 11/23/18 0420  AST 171* 140*  ALT 346* 262*  ALKPHOS 1,731* 1,433*  BILITOT 16.0* 15.0*  PROT 7.7 6.6  ALBUMIN 3.0* 2.5*   Recent Labs  Lab 11/22/18 1230  LIPASE 20   No results for input(s): AMMONIA in the last 168 hours. CBC: Recent Labs  Lab 11/22/18 1230 11/23/18 0420  WBC 8.8 7.3  NEUTROABS 4.8  --   HGB 12.2* 10.9*  HCT 39.4 35.5*  MCV 87.0 87.7  PLT 492* 361   Cardiac Enzymes: No results for input(s): CKTOTAL, CKMB, CKMBINDEX, TROPONINI in the last 168 hours. BNP: Invalid input(s): POCBNP CBG: No results for input(s): GLUCAP in the last 168 hours. D-Dimer No results for input(s): DDIMER in the last 72 hours. Hgb A1c No results for input(s): HGBA1C in the last 72 hours. Lipid Profile No results for input(s): CHOL, HDL, LDLCALC, TRIG, CHOLHDL, LDLDIRECT in the last 72 hours. Thyroid function studies No results for input(s): TSH, T4TOTAL, T3FREE, THYROIDAB in the last 72 hours.  Invalid input(s): FREET3 Anemia work up Recent Labs    11/23/18 0955  FERRITIN 666*   Urinalysis    Component Value Date/Time   COLORURINE AMBER (A) 11/06/2018 1905   APPEARANCEUR CLEAR 11/06/2018 1905   LABSPEC 1.026 11/06/2018 1905   PHURINE 6.0 11/06/2018 1905   GLUCOSEU NEGATIVE 11/06/2018 1905   HGBUR SMALL (A) 11/06/2018 1905   BILIRUBINUR MODERATE (A) 11/06/2018 1905   KETONESUR 20 (A) 11/06/2018 1905   PROTEINUR 30 (A) 11/06/2018 1905   NITRITE NEGATIVE 11/06/2018 1905   LEUKOCYTESUR NEGATIVE 11/06/2018 1905  Sepsis Labs Invalid input(s): PROCALCITONIN,  WBC,  LACTICIDVEN Microbiology Recent Results (from the past 240 hour(s))  SARS Coronavirus 2 by RT PCR  (hospital order, performed in [facility] hospital lab) Nasopharyngeal Nasopharyngeal Swab     Status: None   Collection Time: 11/22/18  3:38 PM   Specimen: Nasopharyngeal Swab  Result Value Ref Range Status   SARS Coronavirus 2 NEGATIVE NEGATIVE Final    Comment: (NOTE) If result is NEGATIVE SARS-CoV-2 target nucleic acids are NOT DETECTED. The SARS-CoV-2 RNA is generally detectable in upper and lower  respiratory specimens during the acute phase of infection. The lowest  concentration of SARS-CoV-2 viral copies this assay can detect is 250  copies / mL. A negative result does not preclude SARS-CoV-2 infection  and should not be used as the sole basis for treatment or other  patient management decisions.  A negative result may occur with  improper specimen collection / handling, submission of specimen other  than nasopharyngeal swab, presence of viral mutation(s) within the  areas targeted by this assay, and inadequate number of viral copies  (<250 copies / mL). A negative result must be combined with clinical  observations, patient history, and epidemiological information. If result is POSITIVE SARS-CoV-2 target nucleic acids are DETECTED. The SARS-CoV-2 RNA is generally detectable in upper and lower  respiratory specimens dur ing the acute phase of infection.  Positive  results are indicative of active infection with SARS-CoV-2.  Clinical  correlation with patient history and other diagnostic information is  necessary to determine patient infection status.  Positive results do  not rule out bacterial infection or co-infection with other viruses. If result is PRESUMPTIVE POSTIVE SARS-CoV-2 nucleic acids MAY BE PRESENT.   A presumptive positive result was obtained on the submitted specimen  and confirmed on repeat testing.  While 2019 novel coronavirus  (SARS-CoV-2) nucleic acids may be present in the submitted sample  additional confirmatory testing may be necessary for  epidemiological  and / or clinical management purposes  to differentiate between  SARS-CoV-2 and other Sarbecovirus currently known to infect humans.  If clinically indicated additional testing with an alternate test  methodology [id number]) is advised. The SARS-CoV-2 RNA is generally  detectable in upper and lower respiratory sp ecimens during the acute  phase of infection. The expected result is Negative. Fact Sheet for Patients:  StrictlyIdeas.no Fact Sheet for Healthcare Providers: BankingDealers.co.za This test is not yet approved or cleared by the [place] FDA and has been authorized for detection and/or diagnosis of SARS-CoV-2 by FDA under an Emergency Use Authorization (EUA).  This EUA will remain in effect (meaning this test can be used) for the duration of the COVID-19 declaration under Section 564(b)(1) of the Act, 21 U.S.C. section 360bbb-3(b)(1), unless the authorization is terminated or revoked sooner. Performed at [facility], [place] [address] [place], [place] [address]      SIGNED:   Cordelia Poche, MD [facility] 11/23/2018, 6:21 PM

## 2018-11-23 NOTE — Progress Notes (Signed)
Patient currently NPO.  Patient has been NPO since last night at midnight.  Last time of po intake approximately 2200 on 11/22/2018 per patient.

## 2018-11-23 NOTE — Progress Notes (Signed)
Progress Note    ASSESSMENT AND PLAN:   [birth year]. 28 yo male with persistent splenomegaly and abnormal liver tests mainly cholestatic pattern. Workup so far negative for viral / autoimmune etiologies. MRCP is unremarkable. Some improvement in liver tests overnight will hydration -Proceed with liver biopsy.  -Follow biopsy will likely start steroids empirically for ? Autoimmune hepatitis. Feels /  looks okay so possibly home on steroids following liver biopsy.    2. Mild anemia, probably dilutional. Baseline hgb ~ 13, at 10.9 today. Getting 100 ml / hr IV fluid   SUBJECTIVE   Feels great. Hungry. No abdominal pain.    OBJECTIVE:     Vital signs in last 24 hours: Temp:  [98.6 F (37 C)-99 F (37.2 C)] 98.6 F (37 C) (10/15 0621) Pulse Rate:  [69-114] 69 (10/15 0621) Resp:  [15-20] 20 (10/15 0621) BP: (114-165)/(69-125) 119/76 (10/15 0621) SpO2:  [97 %-100 %] 100 % (10/15 4970) Weight:  [73.5 kg] 73.5 kg (10/14 1219) Last BM Date: 11/22/18 General:   Alert, well-developed male in NAD. Slightly flushed looking EENT:  Normal hearing, non icteric sclera, conjunctive pink.  Heart:  Regular rate and rhythm;  No lower extremity edema   Pulm: Normal respiratory effort, lungs CTA bilaterally without wheezes or crackles. [name]:  Soft, nondistended, nontender.  Normal bowel sounds.          Neurologic:  Alert and  oriented x4;  grossly normal neurologically. Psych:  Pleasant, cooperative.  Normal mood and affect.   Intake/Output from previous day: 10/14 0701 - 10/15 0700 In: 943 [I.V.:943] Out: -  Intake/Output this shift: No intake/output data recorded.  Lab Results: Recent Labs    11/22/18 1230 11/23/18 0420  WBC 8.8 7.3  HGB 12.2* 10.9*  HCT 39.4 35.5*  PLT 492* 361   BMET Recent Labs    11/22/18 1230 11/23/18 0420  NA 132* 132*  K 3.7 4.2  CL 93* 97*  CO2 27 24  GLUCOSE 101* 104*  BUN 12 14  CREATININE 0.58* 0.68  CALCIUM 9.3 9.1   LFT Recent Labs   11/23/18 0420  PROT 6.6  ALBUMIN 2.5*  AST 140*  ALT 262*  ALKPHOS 1,433*  BILITOT 15.0*   PT/INR Recent Labs    11/22/18 1230 11/23/18 0420  LABPROT 13.0 14.0  INR 1.0 1.1   Hepatitis Panel No results for input(s): HEPBSAG, HCVAB, HEPAIGM, HEPBIGM in the last 72 hours.  Gregory 3d Recon At Scanner  Result Date: 11/22/2018 CLINICAL DATA:  Inpatient. Abnormal liver function tests. Hyperbilirubinemia. Elevated liver enzymes. EXAM: MRI [name] WITHOUT AND WITH CONTRAST (INCLUDING MRCP) TECHNIQUE: Multiplanar multisequence Gregory imaging of the [name] was performed both before and after the administration of intravenous contrast. Heavily T2-weighted images of the biliary and pancreatic ducts were obtained, and three-dimensional MRCP images were rendered by post processing. CONTRAST:  7.54mL GADAVIST GADOBUTROL 1 MMOL/ML IV SOLN COMPARISON:  11/08/2018 a HIDA scan. 11/06/2018 abdominal sonogram. FINDINGS: Lower chest: No acute abnormality at the lung bases. Hepatobiliary: Normal liver size and configuration. No significant hepatic steatosis. Subcentimeter simple segment 4A left liver lobe cyst. No additional liver lesions. Normal gallbladder with no cholelithiasis. No biliary ductal dilatation. Common bile duct diameter 3 mm. No biliary filling defects, strictures or beading. Pancreas: No pancreatic mass or duct dilation.  No pancreas divisum. Spleen: Moderate splenomegaly. Craniocaudal splenic length 17.2 cm no splenic mass. Adrenals/Urinary Tract: Normal adrenals. No hydronephrosis. Normal kidneys with no renal mass. Stomach/Bowel: Normal non-distended stomach.  Visualized small and large bowel is normal caliber, with no bowel wall thickening. Vascular/Lymphatic: Normal caliber abdominal aorta. Patent portal, splenic, hepatic and renal veins. No pathologically enlarged lymph nodes in the [name]. Other: No abdominal ascites or focal fluid collection. Musculoskeletal: No aggressive appearing focal osseous  lesions. IMPRESSION: 1. No morphologic changes of hepatic cirrhosis. No suspicious liver lesions. 2. Normal gallbladder and bile ducts. 3. Moderate splenomegaly.  No abdominal adenopathy. Electronically Signed   By: Delbert Phenix M.D.   On: 11/22/2018 20:06   Gregory [name] Mrcp Vivien Rossetti Contast  Result Date: 11/22/2018 CLINICAL DATA:  Inpatient. Abnormal liver function tests. Hyperbilirubinemia. Elevated liver enzymes. EXAM: MRI [name] WITHOUT AND WITH CONTRAST (INCLUDING MRCP) TECHNIQUE: Multiplanar multisequence Gregory imaging of the [name] was performed both before and after the administration of intravenous contrast. Heavily T2-weighted images of the biliary and pancreatic ducts were obtained, and three-dimensional MRCP images were rendered by post processing. CONTRAST:  7.60mL GADAVIST GADOBUTROL 1 MMOL/ML IV SOLN COMPARISON:  11/08/2018 a HIDA scan. 11/06/2018 abdominal sonogram. FINDINGS: Lower chest: No acute abnormality at the lung bases. Hepatobiliary: Normal liver size and configuration. No significant hepatic steatosis. Subcentimeter simple segment 4A left liver lobe cyst. No additional liver lesions. Normal gallbladder with no cholelithiasis. No biliary ductal dilatation. Common bile duct diameter 3 mm. No biliary filling defects, strictures or beading. Pancreas: No pancreatic mass or duct dilation.  No pancreas divisum. Spleen: Moderate splenomegaly. Craniocaudal splenic length 17.2 cm no splenic mass. Adrenals/Urinary Tract: Normal adrenals. No hydronephrosis. Normal kidneys with no renal mass. Stomach/Bowel: Normal non-distended stomach. Visualized small and large bowel is normal caliber, with no bowel wall thickening. Vascular/Lymphatic: Normal caliber abdominal aorta. Patent portal, splenic, hepatic and renal veins. No pathologically enlarged lymph nodes in the [name]. Other: No abdominal ascites or focal fluid collection. Musculoskeletal: No aggressive appearing focal osseous lesions. IMPRESSION: 1. No  morphologic changes of hepatic cirrhosis. No suspicious liver lesions. 2. Normal gallbladder and bile ducts. 3. Moderate splenomegaly.  No abdominal adenopathy. Electronically Signed   By: Delbert Phenix M.D.   On: 11/22/2018 20:06     Principal Problem:   Abnormal liver function Active Problems:   Elevated liver enzymes     LOS: 0 days   Willette Cluster ,NP 11/23/2018, 8:57 AM

## 2018-11-23 NOTE — Procedures (Signed)
Interventional Radiology Procedure Note  Procedure: US Guided Biopsy of Liver  Complications: None  Estimated Blood Loss: < 10 mL  Findings: 18 G core biopsy of right lobe of liver performed under US guidance.  Three core samples obtained and sent to Pathology.  [name] T. [name], M.D Pager:  [phone]   

## 2018-11-24 NOTE — Progress Notes (Signed)
Patient's IV removed.  Site WNL.  AVS reviewed with patient.  Verbalized understanding of discharge instructions and physician follow-up.  Patient stable.

## 2018-11-28 ENCOUNTER — Telehealth: Payer: Self-pay | Admitting: Internal Medicine

## 2018-11-28 NOTE — Telephone Encounter (Signed)
Pt called regarding liver biopsy report.

## 2018-11-28 NOTE — Telephone Encounter (Signed)
Patient advised that the pathology results have not resulted back. We will call as soon as the results are available,

## 2018-11-29 LAB — SURGICAL PATHOLOGY

## 2020-08-26 ENCOUNTER — Observation Stay (HOSPITAL_COMMUNITY)
Admission: EM | Admit: 2020-08-26 | Discharge: 2020-08-28 | Disposition: A | Discharge status: Home / Self Care | Payer: BC Managed Care – PPO | Attending: General Surgery | Admitting: General Surgery

## 2020-08-26 ENCOUNTER — Other Ambulatory Visit: Payer: Self-pay

## 2020-08-26 ENCOUNTER — Encounter: Payer: Self-pay | Admitting: Emergency Medicine

## 2020-08-26 ENCOUNTER — Encounter (HOSPITAL_COMMUNITY): Payer: Self-pay | Admitting: Emergency Medicine

## 2020-08-26 ENCOUNTER — Emergency Department (HOSPITAL_COMMUNITY): Payer: BC Managed Care – PPO

## 2020-08-26 ENCOUNTER — Ambulatory Visit
Admission: EM | Admit: 2020-08-26 | Discharge: 2020-08-26 | Disposition: A | Discharge status: Home / Self Care | Payer: Self-pay | Attending: Emergency Medicine | Admitting: Emergency Medicine

## 2020-08-26 DIAGNOSIS — K3532 Acute appendicitis with perforation and localized peritonitis, without abscess: Principal | ICD-10-CM | POA: Diagnosis present

## 2020-08-26 DIAGNOSIS — Z20822 Contact with and (suspected) exposure to covid-19: Secondary | ICD-10-CM | POA: Diagnosis not present

## 2020-08-26 DIAGNOSIS — K3580 Unspecified acute appendicitis: Secondary | ICD-10-CM | POA: Diagnosis present

## 2020-08-26 DIAGNOSIS — K3589 Other acute appendicitis without perforation or gangrene: Secondary | ICD-10-CM

## 2020-08-26 DIAGNOSIS — R1084 Generalized abdominal pain: Secondary | ICD-10-CM

## 2020-08-26 DIAGNOSIS — R1031 Right lower quadrant pain: Secondary | ICD-10-CM | POA: Diagnosis present

## 2020-08-26 HISTORY — DX: Acute viral hepatitis, unspecified: B17.9

## 2020-08-26 LAB — URINALYSIS, ROUTINE W REFLEX MICROSCOPIC
Bacteria, UA: NONE SEEN
Bilirubin Urine: NEGATIVE
Glucose, UA: NEGATIVE mg/dL
Hgb urine dipstick: NEGATIVE
Ketones, ur: 5 mg/dL — AB
Leukocytes,Ua: NEGATIVE
Nitrite: NEGATIVE
Protein, ur: 100 mg/dL — AB
Specific Gravity, Urine: 1.029 (ref 1.005–1.030)
pH: 7 (ref 5.0–8.0)

## 2020-08-26 LAB — CBC
HCT: 48.1 % (ref 39.0–52.0)
Hemoglobin: 16 g/dL (ref 13.0–17.0)
MCH: 28.2 pg (ref 26.0–34.0)
MCHC: 33.3 g/dL (ref 30.0–36.0)
MCV: 84.8 fL (ref 80.0–100.0)
Platelets: 315 10*3/uL (ref 150–400)
RBC: 5.67 MIL/uL (ref 4.22–5.81)
RDW: 12.1 % (ref 11.5–15.5)
WBC: 17.4 10*3/uL — ABNORMAL HIGH (ref 4.0–10.5)
nRBC: 0 % (ref 0.0–0.2)

## 2020-08-26 LAB — COMPREHENSIVE METABOLIC PANEL
ALT: 36 U/L (ref 0–44)
AST: 20 U/L (ref 15–41)
Albumin: 4.6 g/dL (ref 3.5–5.0)
Alkaline Phosphatase: 135 U/L — ABNORMAL HIGH (ref 38–126)
Anion gap: 10 (ref 5–15)
BUN: 11 mg/dL (ref 6–20)
CO2: 30 mmol/L (ref 22–32)
Calcium: 9.9 mg/dL (ref 8.9–10.3)
Chloride: 98 mmol/L (ref 98–111)
Creatinine, Ser: 0.93 mg/dL (ref 0.61–1.24)
GFR, Estimated: >60 mL/min (ref >60)
Glucose, Bld: 117 mg/dL — ABNORMAL HIGH (ref 70–99)
Potassium: 4.5 mmol/L (ref 3.5–5.1)
Sodium: 138 mmol/L (ref 135–145)
Total Bilirubin: 0.9 mg/dL (ref 0.3–1.2)
Total Protein: 8.2 g/dL — ABNORMAL HIGH (ref 6.5–8.1)

## 2020-08-26 LAB — LIPASE, BLOOD: Lipase: 35 U/L (ref 11–51)

## 2020-08-26 MED ORDER — DIPHENHYDRAMINE HCL 50 MG/ML IJ SOLN: 12.5000 mg | Freq: Four times a day (QID) | INTRAMUSCULAR | Status: DC | PRN

## 2020-08-26 MED ORDER — SODIUM CHLORIDE 0.9 % IV SOLN
2.0000 g | INTRAVENOUS | Status: DC
  Administered 2020-08-27: 2 g via INTRAVENOUS
  Filled 2020-08-26: qty 20

## 2020-08-26 MED ORDER — PANTOPRAZOLE SODIUM 40 MG IV SOLR
40.0000 mg | Freq: Every day | INTRAVENOUS | Status: DC
  Administered 2020-08-27 (×2): 40 mg via INTRAVENOUS
  Filled 2020-08-26 (×2): qty 40

## 2020-08-26 MED ORDER — SIMETHICONE 80 MG PO CHEW: 40.0000 mg | CHEWABLE_TABLET | Freq: Four times a day (QID) | ORAL | Status: DC | PRN

## 2020-08-26 MED ORDER — METRONIDAZOLE 500 MG/100ML IV SOLN
500.0000 mg | Freq: Three times a day (TID) | INTRAVENOUS | Status: DC
  Administered 2020-08-27 (×3): 500 mg via INTRAVENOUS
  Filled 2020-08-26 (×3): qty 100

## 2020-08-26 MED ORDER — IOHEXOL 300 MG/ML  SOLN
100.0000 mL | Freq: Once | INTRAMUSCULAR | Status: AC | PRN
  Administered 2020-08-26: 100 mL via INTRAVENOUS

## 2020-08-26 MED ORDER — ONDANSETRON HCL 4 MG/2ML IJ SOLN
4.0000 mg | Freq: Four times a day (QID) | INTRAMUSCULAR | Status: DC | PRN
  Administered 2020-08-27 (×2): 4 mg via INTRAVENOUS
  Filled 2020-08-26 (×4): qty 2

## 2020-08-26 MED ORDER — LACTATED RINGERS IV SOLN: INTRAVENOUS | Status: DC

## 2020-08-26 MED ORDER — MORPHINE SULFATE (PF) 2 MG/ML IV SOLN
2.0000 mg | INTRAVENOUS | Status: DC | PRN
  Administered 2020-08-27 (×2): 2 mg via INTRAVENOUS
  Filled 2020-08-26 (×4): qty 1

## 2020-08-26 MED ORDER — KETOROLAC TROMETHAMINE 30 MG/ML IJ SOLN
30.0000 mg | Freq: Once | INTRAMUSCULAR | Status: AC
  Administered 2020-08-26: 30 mg via INTRAMUSCULAR

## 2020-08-26 MED ORDER — OXYCODONE HCL 5 MG PO TABS: 5.0000 mg | ORAL_TABLET | ORAL | Status: DC | PRN

## 2020-08-26 MED ORDER — ONDANSETRON 8 MG PO TBDP
8.0000 mg | ORAL_TABLET | Freq: Once | ORAL | Status: AC
  Administered 2020-08-26: 8 mg via ORAL

## 2020-08-26 MED ORDER — ONDANSETRON 4 MG PO TBDP: 4.0000 mg | ORAL_TABLET | Freq: Four times a day (QID) | ORAL | Status: DC | PRN

## 2020-08-26 MED ORDER — FENTANYL CITRATE (PF) 100 MCG/2ML IJ SOLN
50.0000 ug | Freq: Once | INTRAMUSCULAR | Status: AC
  Administered 2020-08-27: 50 ug via INTRAVENOUS
  Filled 2020-08-26: qty 2

## 2020-08-26 MED ORDER — DIPHENHYDRAMINE HCL 12.5 MG/5ML PO ELIX: 12.5000 mg | ORAL_SOLUTION | Freq: Four times a day (QID) | ORAL | Status: DC | PRN

## 2020-08-26 MED ORDER — SODIUM CHLORIDE 0.9 % IV BOLUS
1000.0000 mL | Freq: Once | INTRAVENOUS | Status: AC
  Administered 2020-08-26: 1000 mL via INTRAVENOUS

## 2020-08-26 NOTE — ED Triage Notes (Signed)
Pt here with severe generalized abdominal pain with N/V. Denies diarrhea. Started yesterday. Hx of Acetaminophen induced liver failure.

## 2020-08-26 NOTE — ED Provider Notes (Signed)
[facility] Provider Note   CSN: [id number] Arrival date & time: 08/26/20  1919     History Chief Complaint  Patient presents with   Abdominal Pain    Pedrohenrique AJA BOLANDER is a 30 y.o. male who presents for 2 days of abdominal pain.  States his pain started on Sunday which he thought was musculoskeletal from jumping and doing flips in the pool the day prior.  States the pain started around his umbilical area and the lower right and left abdomen and then traveled up and started to cause some pain diffusely.  States walking makes it worse.  States pain prior to going to urgent care today was 10 out of 10, but after Toradol shot pain is currently 3 out of 10.  States he has been nauseous and has not been able to keep anything down yesterday or today.  Endorses nonbloody and emesis for the past 2 days.  Also reports boss with similar symptoms of nausea and vomiting.  Endorses decreased urination, and normal bowel movements.  Denies recent infection or fever.  Presented to the urgent care today who gave him Toradol and Zofran recommended transfer to the ED for further work-up.  Hx of hepatitis Denies alcohol or recreational drug use   Past Medical History:  Diagnosis Date   Acute hepatitis    ADHD (attention deficit hyperactivity disorder)    denies taking meds   Bipolar disorder ([place])    Pt reports that this runs in his family but he has never been officially diagnosed. Pt reports that his mom used to give him Seroquel as a child but "made me feel like a zombie so I wouldn't take it".   Headache(784.0)    Pneumonia     Patient Active Problem List   Diagnosis Date Noted   Elevated liver enzymes 11/22/2018   Jaundice    Non-intractable vomiting    Splenomegaly    Fever, unspecified    SIRS (systemic inflammatory response syndrome) ([place]) 11/06/2018   Sepsis ([place]) 11/06/2018   Abnormal liver function 11/06/2018   Leukocytosis 11/06/2018   Acute lower UTI  11/06/2018   Hypokalemia 11/06/2018   Pain in right testicle 11/07/2017   ACL tear 07/03/2013    Past Surgical History:  Procedure Laterality Date   ANTERIOR CRUCIATE LIGAMENT REPAIR Right 07/03/2013   Procedure: RECONSTRUCTION ANTERIOR CRUCIATE LIGAMENT (ACL) WITH HAMSTRING GRAFT;  Surgeon: Meredith Pel, MD;  Location: [facility];  Service: Orthopedics;  Laterality: Right;  RIGHT KNEE ANTERIOR CRUCIATE LIGAMENT RECONSTRUCTION, POSTERIOR CRUCIATE LIGAMENT RECONSTRUCTION, MENISCAL  DEBRIDEMENT, HAMSTRING AUTOGRAFT.   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 Pt reports that heart lining of cow was then attached so that tissue would expand as pt grew. Dr. Glenna Fellows performed surgery.   FRACTURE SURGERY  2010   nasal fracture   IR US GUIDE BX ASP/DRAIN  11/23/2018   KNEE SURGERY     POSTERIOR CRUCIATE LIGAMENT RECONSTRUCTION Right 07/03/2013   Procedure: RECONSTRUCTION OF FIBULAR COLLATERAL LIGAMENT;  Surgeon: Meredith Pel, MD;  Location: [facility];  Service: Orthopedics;  Laterality: Right;       Family History  Problem Relation Age of Onset   Diabetes Mother    Chiari malformation Mother     Social History   Tobacco Use   Smoking status: Never   Smokeless tobacco: Never  Vaping Use   Vaping Use:  Never used  Substance Use Topics   Alcohol use: No   Drug use: No    Home Medications Prior to Admission medications   Medication Sig Start Date End Date Taking? Authorizing Provider  ibuprofen (ADVIL) 200 MG tablet Take 200 mg by mouth every 6 (six) hours as needed.   Yes [provider]    Allergies    Acetaminophen  Review of Systems   Review of Systems  Constitutional:  Positive for activity change and fever.  Gastrointestinal:  Positive for abdominal pain, nausea and vomiting. Negative for blood in stool, constipation and diarrhea.  Genitourinary:  Positive for decreased urine  volume. Negative for flank pain.  All other systems reviewed and are negative.  Physical Exam Updated Vital Signs BP (!) 131/91 (BP Location: Right Arm)   Pulse 88   Temp 98.7 F (37.1 C) (Oral)   Resp 17   Ht 6' (1.829 m)   Wt 73.5 kg   SpO2 99%   BMI 21.98 kg/m   Physical Exam Constitutional:      General: He is not in acute distress. HENT:     Head: Normocephalic and atraumatic.  Eyes:     General: No scleral icterus. Cardiovascular:     Rate and Rhythm: Normal rate and regular rhythm.     Heart sounds: Normal heart sounds.  Pulmonary:     Effort: Pulmonary effort is normal.     Breath sounds: Normal breath sounds.  Abdominal:     General: Bowel sounds are normal. There is no distension.     Palpations: Abdomen is soft. There is no mass.     Tenderness: There is generalized abdominal tenderness. There is no right CVA tenderness, left CVA tenderness or rebound. Positive signs include Murphy's sign.     Hernia: No hernia is present.  Skin:    General: Skin is warm and dry.  Neurological:     Mental Status: He is alert.    ED Results / Procedures / Treatments   Labs (all labs ordered are listed, but only abnormal results are displayed) Labs Reviewed  COMPREHENSIVE METABOLIC PANEL - Abnormal; Notable for the following components:      Result Value   Glucose, Bld 117 (*)    Total Protein 8.2 (*)    Alkaline Phosphatase 135 (*)    All other components within normal limits  CBC - Abnormal; Notable for the following components:   WBC 17.4 (*)    All other components within normal limits  URINALYSIS, ROUTINE W REFLEX MICROSCOPIC - Abnormal; Notable for the following components:   Ketones, ur 5 (*)    Protein, ur 100 (*)    All other components within normal limits  RESP PANEL BY RT-PCR (FLU A&B, COVID) ARPGX2  LIPASE, BLOOD    EKG None  Radiology CT ABDOMEN PELVIS W CONTRAST  Result Date: 08/26/2020 CLINICAL DATA:  Generalized abdominal pain, nausea and  vomiting since yesterday, history of liver failure EXAM: CT ABDOMEN AND PELVIS WITH CONTRAST TECHNIQUE: Multidetector CT imaging of the abdomen and pelvis was performed using the standard protocol following bolus administration of intravenous contrast. CONTRAST:  159m OMNIPAQUE IOHEXOL 300 MG/ML  SOLN COMPARISON:  11/22/2018 FINDINGS: Lower chest: No acute pleural or parenchymal lung disease. Hepatobiliary: No focal liver abnormality is seen. No gallstones, gallbladder wall thickening, or biliary dilatation. Pancreas: Unremarkable. No pancreatic ductal dilatation or surrounding inflammatory changes. Spleen: Normal in size without focal abnormality. Adrenals/Urinary Tract: The adrenals are unremarkable. 5 mm nonobstructing  right renal calculus. Left kidney is unremarkable. The bladder is minimally distended with no focal abnormalities. Stomach/Bowel: No bowel obstruction or ileus. A dilated inflamed appendix is seen in the right lower quadrant measuring up to 15 mm in diameter. There is extensive mural thickening and periappendiceal fat stranding. 12 mm appendicolith is seen within the proximal appendix. No evidence of perforation, fluid collection, or abscess. There is extensive mesenteric edema and small volume free fluid in the right lower quadrant. Vascular/Lymphatic: No significant vascular findings are present. No enlarged abdominal or pelvic lymph nodes. Subcentimeter lymph nodes in the right lower quadrant mesentery are reactive. Reproductive: Prostate is unremarkable. Other: Small volume free fluid right lower quadrant as above. No evidence of pneumoperitoneum. No abdominal wall hernia. Musculoskeletal: No acute or destructive bony lesions. Reconstructed images demonstrate no additional findings. IMPRESSION: 1. Acute uncomplicated appendicitis. Significant periappendiceal inflammatory changes without perforation, fluid collection, or abscess. 2. Nonobstructing 5 mm right renal calculus. Electronically  Signed   By: Randa Ngo M.D.   On: 08/26/2020 22:58    Procedures Procedures   Medications Ordered in ED Medications  sodium chloride 0.9 % bolus 1,000 mL (1,000 mLs Intravenous New Bag/Given 08/26/20 2149)  iohexol (OMNIPAQUE) 300 MG/ML solution 100 mL (100 mLs Intravenous Contrast Given 08/26/20 2240)    ED Course  I have reviewed the triage vital signs and the nursing notes.  Pertinent labs & imaging results that were available during my care of the patient were reviewed by me and considered in my medical decision making (see chart for details).    MDM Rules/Calculators/A&P                           Patient is a 30 year old who presents with 2 days of abdominal pain that started in the lower right and left quadrants and spread diffusely, worsened with walking.  Also presents with 2 days of nausea and nonbloody nonbilious emesis.  Initially presented to urgent care who gave him Zofran and Toradol, and recommended transfer to ED for further work-up.  CT abdomen pelvis with acute uncomplicated appendicitis without perforation, fluid collection, or abscess. Also shows non obstructing 79m right renal calculus  CBC with WBC 17.4 CMP with alk phos mildly elevated at 135  Lipase normal  UA with ketones and protein   Gave 1L NS bolus   General surgery consulted will remove appendix tomorrow and start antibiotics   Signed out to night ED provider   Final Clinical Impression(s) / ED Diagnoses Final diagnoses:  None    Rx / DC Orders ED Discharge Orders     None        PShary Key DO 08/26/20 2347    ZElnora Morrison MD 08/27/20 [phone]

## 2020-08-26 NOTE — Progress Notes (Signed)
[facility]  Acute appendicitis with appendicolith on CT. Will discuss surgery tomorrow. Orders in. COVID pending.  Algis Greenhouse, MD [facility] [address] [place] [place], [place] [address] [phone] (office)

## 2020-08-26 NOTE — ED Notes (Signed)
Patient is asleep.  

## 2020-08-26 NOTE — ED Provider Notes (Signed)
HPI  SUBJECTIVE:  Gregory [name] is a 30 y.o. male who presents with the acute onset of "severe" abdominal pain starting yesterday.  It is constant.  It started in the periumbilical/lower region and now has spread across his entire abdomen.  He reports nausea, 5 episodes of nonbilious, nonbloody emesis.  States that he is unable to tolerate any p.o. whatsoever.  He had 2 normal bowel movements today.  He states that his right testicle is starting to hurt as well.  No change in urine output.  No fevers, abdominal distention, back pain, chest pain, shortness of breath, urinary complaints, penile rash or discharge.  He is in a long-term monogamous relationship with his wife who is asymptomatic.  STDs are not a concern today.  He tried chicken noodle soup, bland foods and Gatorade which he states he threw up.  Symptoms are better with lying down and being still, worse with walking and movement.  He states that the car ride over here was painful.  No COVID symptoms of body aches, headaches, nasal congestion, rhinorrhea, sore throat, loss of sense of smell or taste, cough, shortness of breath.  No known COVID exposure.  He had a second dose of COVID-vaccine.  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  No alcohol use, does not take any Tylenol.  No history of gallbladder disease, Crohn's, abdominal surgeries, small bowel obstruction.  PMD: None    Past Medical History:  Diagnosis Date   Acute hepatitis    ADHD (attention deficit hyperactivity disorder)    denies taking meds   Bipolar disorder ([facility])    Pt reports that this runs in his family but he has never been officially diagnosed. Pt reports that his mom used to give him Seroquel as a child but "made me feel like a zombie so I wouldn't take it".   Headache(784.0)    Pneumonia      Past Surgical History:  Procedure Laterality Date   ANTERIOR CRUCIATE LIGAMENT REPAIR Right 07/03/2013   Procedure: RECONSTRUCTION ANTERIOR CRUCIATE LIGAMENT (ACL) WITH HAMSTRING GRAFT;  Surgeon: [name], MD;  Location: [facility];  Service: Orthopedics;  Laterality: Right;  RIGHT KNEE ANTERIOR CRUCIATE LIGAMENT RECONSTRUCTION, POSTERIOR CRUCIATE LIGAMENT RECONSTRUCTION, MENISCAL  DEBRIDEMENT, HAMSTRING AUTOGRAFT.   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 Pt reports that heart lining of cow was then attached so that tissue would expand as pt grew. Dr. Trey Sailors performed surgery.   FRACTURE SURGERY  2010   nasal fracture   IR US GUIDE BX ASP/DRAIN  11/23/2018   KNEE SURGERY     POSTERIOR CRUCIATE LIGAMENT RECONSTRUCTION Right 07/03/2013   Procedure: RECONSTRUCTION OF FIBULAR COLLATERAL LIGAMENT;  Surgeon: [name], MD;  Location: [facility];  Service: Orthopedics;  Laterality: Right;    Family History  Problem Relation Age of Onset   Diabetes Mother    Chiari malformation Mother     Social History   Tobacco Use   Smoking status: Never   Smokeless tobacco: Never  Vaping Use   Vaping Use: Never used  Substance Use Topics   Alcohol use: No   Drug use: No     Current Facility-Administered Medications:    ketorolac (TORADOL) 30 MG/ML  injection 30 mg, 30 mg, Intramuscular, Once, Domenick Gong, MD   ondansetron (ZOFRAN-ODT) disintegrating tablet 8 mg, 8 mg, Oral, Once, Domenick Gong, MD No current outpatient medications on file.  Allergies  Allergen Reactions   Acetaminophen     Hx of hepatitis      ROS  As noted in HPI.   Physical Exam  BP (!) 134/96   Pulse 89   Temp 98.6 F (37 C) (Oral)   Resp 18   SpO2 100%   Constitutional: Well developed, well nourished, appears uncomfortable. Eyes:  EOMI, conjunctiva normal bilaterally HENT: Normocephalic,  atraumatic,mucus membranes moist Respiratory: Normal inspiratory effort Cardiovascular: Normal rate, regular rhythm, no murmurs rubs or gallop GI: nondistended.  Normal appearance, soft.  Diffuse abdominal tenderness maximal in the right lower quadrant, right upper quadrant and left upper quadrant.  Hypoactive bowel sounds.  No guarding, rebound.  Negative Murphy.  Negative obturator.  Negative Rovsing's.  Negative tap table test. GU: Normal circumcised male, testes descended bilaterally.  No testicular or scrotal swelling, tenderness.  No epididymal tenderness. skin: No rash, skin intact Musculoskeletal: no deformities Neurologic: Alert & oriented x 3, no focal neuro deficits Psychiatric: Speech and behavior appropriate   ED Course   Medications  ketorolac (TORADOL) 30 MG/ML injection 30 mg (has no administration in time range)  ondansetron (ZOFRAN-ODT) disintegrating tablet 8 mg (has no administration in time range)    No orders of the defined types were placed in this encounter.   No results found for this or any previous visit (from the past 24 hour(s)). No results found.  ED Clinical Impression  1. Generalized abdominal pain      ED Assessment/Plan  Patient with diffuse abdominal pain beginning in the lower abdomen, now primarily in the right side and upper abdomen.  The differential is colitis, diverticulitis, pancreatitis, cholecystitis, cholelithiasis, hepatitis, appendicitis, nephrolithiasis.  There does not appear to be any evidence of testicular torsion.  Feel the patient needs labs and imaging to rule out emergent cause of his symptoms.  Giving him 8 mg of Zofran and 30 mg of Toradol IM here.  Transferring to the [facility] emergency department for comprehensive evaluation.  Feel that he is stable to go by  private vehicle.  Nothing to eat or drink until ER evaluation is complete.  Discussed medical decision making, rationale for transfer to the emergency  department with patient.  He agrees with plan.  Meds ordered this encounter  Medications   ketorolac (TORADOL) 30 MG/ML injection 30 mg   ondansetron (ZOFRAN-ODT) disintegrating tablet 8 mg      *This clinic note was created using Scientist, clinical (histocompatibility and immunogenetics). Therefore, there may be occasional mistakes despite careful proofreading.  ?    Domenick Gong, MD 08/26/20 1820

## 2020-08-26 NOTE — Discharge Instructions (Addendum)
We have given you 30 mg of Toradol and 8 mg of Zofran.  Go immediately to the emergency department.  [facility] or [facility] may be your best bet.  nothing to eat or drink until your ER evaluation is complete.  Let them know if your pain changes, gets worse, or for any concerns.

## 2020-08-26 NOTE — ED Triage Notes (Signed)
Pt seen at [facility] earlier today and advised to come to ED for complete evaluation. Pt c/o generalized abd pain with N/V that started yesterday.

## 2020-08-27 ENCOUNTER — Observation Stay (HOSPITAL_COMMUNITY): Payer: BC Managed Care – PPO | Admitting: Certified Registered"

## 2020-08-27 ENCOUNTER — Encounter (HOSPITAL_COMMUNITY)
Admission: EM | Disposition: A | Discharge status: Home / Self Care | Payer: Self-pay | Source: Home / Self Care | Attending: Emergency Medicine

## 2020-08-27 DIAGNOSIS — K3532 Acute appendicitis with perforation and localized peritonitis, without abscess: Principal | ICD-10-CM

## 2020-08-27 HISTORY — PX: LAPAROSCOPIC APPENDECTOMY: SHX408

## 2020-08-27 LAB — SURGICAL PCR SCREEN
MRSA, PCR: NEGATIVE
Staphylococcus aureus: NEGATIVE

## 2020-08-27 LAB — HIV ANTIBODY (ROUTINE TESTING W REFLEX): HIV Screen 4th Generation wRfx: NONREACTIVE

## 2020-08-27 LAB — RESP PANEL BY RT-PCR (FLU A&B, COVID) ARPGX2
Influenza A by PCR: NEGATIVE
Influenza B by PCR: NEGATIVE
SARS Coronavirus 2 by RT PCR: NEGATIVE

## 2020-08-27 SURGERY — APPENDECTOMY, LAPAROSCOPIC
Anesthesia: General | Site: Abdomen

## 2020-08-27 MED ORDER — SUCCINYLCHOLINE CHLORIDE 20 MG/ML IJ SOLN
INTRAMUSCULAR | Status: DC | PRN
  Administered 2020-08-27: 140 mg via INTRAVENOUS

## 2020-08-27 MED ORDER — FENTANYL CITRATE (PF) 100 MCG/2ML IJ SOLN
INTRAMUSCULAR | Status: DC | PRN
  Administered 2020-08-27: 50 ug via INTRAVENOUS
  Administered 2020-08-27: 150 ug via INTRAVENOUS
  Administered 2020-08-27: 50 ug via INTRAVENOUS

## 2020-08-27 MED ORDER — MIDAZOLAM HCL 2 MG/2ML IJ SOLN
INTRAMUSCULAR | Status: AC
  Filled 2020-08-27: qty 2

## 2020-08-27 MED ORDER — FENTANYL CITRATE (PF) 250 MCG/5ML IJ SOLN
INTRAMUSCULAR | Status: AC
  Filled 2020-08-27: qty 5

## 2020-08-27 MED ORDER — DEXAMETHASONE SODIUM PHOSPHATE 10 MG/ML IJ SOLN
INTRAMUSCULAR | Status: DC | PRN
  Administered 2020-08-27: 10 mg via INTRAVENOUS

## 2020-08-27 MED ORDER — PIPERACILLIN-TAZOBACTAM 3.375 G IVPB
3.3750 g | Freq: Three times a day (TID) | INTRAVENOUS | Status: DC
Start: 2020-08-28 — End: 2020-08-28
  Administered 2020-08-28: 3.375 g via INTRAVENOUS
  Filled 2020-08-27 (×6): qty 50

## 2020-08-27 MED ORDER — PIPERACILLIN-TAZOBACTAM 3.375 G IVPB
3.3750 g | Freq: Once | INTRAVENOUS | Status: AC
  Administered 2020-08-27: 3.375 g via INTRAVENOUS
  Filled 2020-08-27 (×2): qty 50

## 2020-08-27 MED ORDER — ONDANSETRON HCL 4 MG/2ML IJ SOLN
INTRAMUSCULAR | Status: DC | PRN
  Administered 2020-08-27: 4 mg via INTRAVENOUS

## 2020-08-27 MED ORDER — PROPOFOL 10 MG/ML IV BOLUS
INTRAVENOUS | Status: DC | PRN
  Administered 2020-08-27: 50 mg via INTRAVENOUS
  Administered 2020-08-27: 200 mg via INTRAVENOUS

## 2020-08-27 MED ORDER — BUPIVACAINE LIPOSOME 1.3 % IJ SUSP
INTRAMUSCULAR | Status: DC | PRN
  Administered 2020-08-27: 20 mL

## 2020-08-27 MED ORDER — TRAMADOL HCL 50 MG PO TABS: 50.0000 mg | ORAL_TABLET | Freq: Four times a day (QID) | ORAL | Status: DC | PRN

## 2020-08-27 MED ORDER — CHLORHEXIDINE GLUCONATE CLOTH 2 % EX PADS
6.0000 | MEDICATED_PAD | Freq: Once | CUTANEOUS | Status: AC
  Administered 2020-08-27: 6 via TOPICAL

## 2020-08-27 MED ORDER — LACTATED RINGERS IV SOLN: INTRAVENOUS | Status: DC

## 2020-08-27 MED ORDER — PROPOFOL 10 MG/ML IV BOLUS
INTRAVENOUS | Status: AC
  Filled 2020-08-27: qty 20

## 2020-08-27 MED ORDER — SUCCINYLCHOLINE CHLORIDE 200 MG/10ML IV SOSY
PREFILLED_SYRINGE | INTRAVENOUS | Status: AC
  Filled 2020-08-27: qty 10

## 2020-08-27 MED ORDER — LIDOCAINE 2% (20 MG/ML) 5 ML SYRINGE
INTRAMUSCULAR | Status: DC | PRN
  Administered 2020-08-27: 100 mg via INTRAVENOUS

## 2020-08-27 MED ORDER — MUPIROCIN 2 % EX OINT
1.0000 "application " | TOPICAL_OINTMENT | Freq: Two times a day (BID) | CUTANEOUS | Status: DC
  Administered 2020-08-27 (×3): 1 via NASAL
  Filled 2020-08-27: qty 22

## 2020-08-27 MED ORDER — HYDROMORPHONE HCL 1 MG/ML IJ SOLN: 0.2500 mg | INTRAMUSCULAR | Status: DC | PRN

## 2020-08-27 MED ORDER — SUGAMMADEX SODIUM 200 MG/2ML IV SOLN
INTRAVENOUS | Status: DC | PRN
  Administered 2020-08-27: 200 mg via INTRAVENOUS

## 2020-08-27 MED ORDER — ONDANSETRON HCL 4 MG/2ML IJ SOLN
INTRAMUSCULAR | Status: AC
  Filled 2020-08-27: qty 2

## 2020-08-27 MED ORDER — DEXMEDETOMIDINE (PRECEDEX) IN NS 20 MCG/5ML (4 MCG/ML) IV SYRINGE
PREFILLED_SYRINGE | INTRAVENOUS | Status: DC | PRN
  Administered 2020-08-27: 12 ug via INTRAVENOUS

## 2020-08-27 MED ORDER — LIDOCAINE HCL (PF) 2 % IJ SOLN
INTRAMUSCULAR | Status: AC
  Filled 2020-08-27: qty 5

## 2020-08-27 MED ORDER — ONDANSETRON HCL 4 MG/2ML IJ SOLN: 4.0000 mg | Freq: Once | INTRAMUSCULAR | Status: DC | PRN

## 2020-08-27 MED ORDER — ORAL CARE MOUTH RINSE: 15.0000 mL | Freq: Once | OROMUCOSAL | Status: AC

## 2020-08-27 MED ORDER — ROCURONIUM BROMIDE 10 MG/ML (PF) SYRINGE
PREFILLED_SYRINGE | INTRAVENOUS | Status: DC | PRN
  Administered 2020-08-27: 40 mg via INTRAVENOUS

## 2020-08-27 MED ORDER — SODIUM CHLORIDE 0.9 % IV SOLN
12.5000 mg | Freq: Once | INTRAVENOUS | Status: AC
  Administered 2020-08-27: 12.5 mg via INTRAVENOUS
  Filled 2020-08-27: qty 0.5

## 2020-08-27 MED ORDER — SODIUM CHLORIDE 0.9 % IR SOLN
Status: DC | PRN
  Administered 2020-08-27: 1000 mL

## 2020-08-27 MED ORDER — DEXAMETHASONE SODIUM PHOSPHATE 10 MG/ML IJ SOLN
INTRAMUSCULAR | Status: AC
  Filled 2020-08-27: qty 2

## 2020-08-27 MED ORDER — PHENYLEPHRINE 40 MCG/ML (10ML) SYRINGE FOR IV PUSH (FOR BLOOD PRESSURE SUPPORT)
PREFILLED_SYRINGE | INTRAVENOUS | Status: DC | PRN
  Administered 2020-08-27: 80 ug via INTRAVENOUS

## 2020-08-27 MED ORDER — MEPERIDINE HCL 50 MG/ML IJ SOLN: 6.2500 mg | INTRAMUSCULAR | Status: DC | PRN

## 2020-08-27 MED ORDER — FENTANYL CITRATE (PF) 100 MCG/2ML IJ SOLN
INTRAMUSCULAR | Status: AC
  Filled 2020-08-27: qty 2

## 2020-08-27 MED ORDER — MIDAZOLAM HCL 5 MG/5ML IJ SOLN
INTRAMUSCULAR | Status: DC | PRN
  Administered 2020-08-27 (×2): 2 mg via INTRAVENOUS

## 2020-08-27 MED ORDER — SODIUM CHLORIDE 0.9 % IV SOLN
INTRAVENOUS | Status: AC
  Filled 2020-08-27: qty 2

## 2020-08-27 MED ORDER — ROCURONIUM BROMIDE 10 MG/ML (PF) SYRINGE
PREFILLED_SYRINGE | INTRAVENOUS | Status: AC
  Filled 2020-08-27: qty 10

## 2020-08-27 MED ORDER — CHLORHEXIDINE GLUCONATE 0.12 % MT SOLN
15.0000 mL | Freq: Once | OROMUCOSAL | Status: AC
  Administered 2020-08-27: 15 mL via OROMUCOSAL
  Filled 2020-08-27: qty 15

## 2020-08-27 MED ORDER — LACTATED RINGERS IV SOLN: INTRAVENOUS | Status: DC | PRN

## 2020-08-27 MED ORDER — BUPIVACAINE LIPOSOME 1.3 % IJ SUSP
INTRAMUSCULAR | Status: AC
  Filled 2020-08-27: qty 20

## 2020-08-27 MED ORDER — SODIUM CHLORIDE 0.9 % IV SOLN
2.0000 g | INTRAVENOUS | Status: AC
  Administered 2020-08-27: 2 g via INTRAVENOUS
  Filled 2020-08-27: qty 2

## 2020-08-27 SURGICAL SUPPLY — 40 items
BAG RETRIEVAL 10 (BASKET) ×1
BLADE SURG 15 STRL LF DISP TIS (BLADE) ×1 IMPLANT
BLADE SURG 15 STRL SS (BLADE) ×2
CHLORAPREP W/TINT 26 (MISCELLANEOUS) ×2 IMPLANT
CLOTH BEACON ORANGE TIMEOUT ST (SAFETY) ×2 IMPLANT
COVER LIGHT HANDLE STERIS (MISCELLANEOUS) ×4 IMPLANT
CUTTER FLEX LINEAR 45M (STAPLE) ×2 IMPLANT
DERMABOND ADVANCED (GAUZE/BANDAGES/DRESSINGS) ×1
DERMABOND ADVANCED .7 DNX12 (GAUZE/BANDAGES/DRESSINGS) ×1 IMPLANT
ELECT REM PT RETURN 9FT ADLT (ELECTROSURGICAL) ×2
ELECTRODE REM PT RTRN 9FT ADLT (ELECTROSURGICAL) ×1 IMPLANT
GLOVE SURG ENC MOIS LTX SZ6.5 (GLOVE) ×2 IMPLANT
GLOVE SURG UNDER POLY LF SZ6.5 (GLOVE) ×2 IMPLANT
GLOVE SURG UNDER POLY LF SZ7 (GLOVE) ×6 IMPLANT
GOWN STRL REUS W/TWL LRG LVL3 (GOWN DISPOSABLE) ×4 IMPLANT
INST SET LAPROSCOPIC AP (KITS) ×2 IMPLANT
KIT TURNOVER KIT A (KITS) ×2 IMPLANT
MANIFOLD NEPTUNE II (INSTRUMENTS) ×2 IMPLANT
NEEDLE HYPO 18GX1.5 BLUNT FILL (NEEDLE) ×2 IMPLANT
NEEDLE HYPO 21X1.5 SAFETY (NEEDLE) ×2 IMPLANT
NEEDLE INSUFFLATION 14GA 120MM (NEEDLE) ×2 IMPLANT
NS IRRIG 1000ML POUR BTL (IV SOLUTION) ×2 IMPLANT
PACK LAP CHOLE LZT030E (CUSTOM PROCEDURE TRAY) ×2 IMPLANT
PAD ARMBOARD 7.5X6 YLW CONV (MISCELLANEOUS) ×2 IMPLANT
RELOAD STAPLE TA45 3.5 REG BLU (ENDOMECHANICALS) ×2 IMPLANT
SET BASIN LINEN APH (SET/KITS/TRAYS/PACK) ×2 IMPLANT
SET TUBE SMOKE EVAC HIGH FLOW (TUBING) ×2 IMPLANT
SHEARS HARMONIC ACE PLUS 36CM (ENDOMECHANICALS) ×2 IMPLANT
SUT MNCRL AB 4-0 PS2 18 (SUTURE) ×4 IMPLANT
SUT VICRYL 0 UR6 27IN ABS (SUTURE) ×2 IMPLANT
SYR 20ML LL LF (SYRINGE) ×4 IMPLANT
SYS BAG RETRIEVAL 10MM (BASKET) ×1
SYSTEM BAG RETRIEVAL 10MM (BASKET) ×1 IMPLANT
TRAY FOLEY W/BAG SLVR 16FR (SET/KITS/TRAYS/PACK) ×2
TRAY FOLEY W/BAG SLVR 16FR ST (SET/KITS/TRAYS/PACK) ×1 IMPLANT
TROCAR ENDO BLADELESS 11MM (ENDOMECHANICALS) ×2 IMPLANT
TROCAR ENDO BLADELESS 12MM (ENDOMECHANICALS) ×2 IMPLANT
TROCAR XCEL NON-BLD 5MMX100MML (ENDOMECHANICALS) ×2 IMPLANT
TUBE CONNECTING 12X1/4 (SUCTIONS) ×2 IMPLANT
WARMER LAPAROSCOPE (MISCELLANEOUS) ×2 IMPLANT

## 2020-08-27 NOTE — Op Note (Signed)
[facility]  Date of Surgery: 08/27/2020  Admit Date: 08/26/2020   Performing Service: General  Surgeon(s) and Role:    Lucretia Roers, MD - Primary   Pre-operative Diagnosis: Acute Appendicitis  Post-operative Diagnosis: Acute Perforated Appendicitis with necrosis   Procedure Performed: Laparoscopic Appendectomy   Surgeon: Leatrice Jewels. Henreitta Leber, MD   Assistant: No qualified resident was available.   Anesthesia: General   Findings:  The appendix was found to be inflamed. There were signs of necrosis. There was perforation. There was not abscess formation.  Suctioned out purulence from behind cecum.   Estimated Blood Loss: Minimal    Specimens:  ID Type Source Tests Collected by Time Destination  1 : Appendix Tissue PATH Appendix SURGICAL PATHOLOGY Lucretia Roers, MD 08/27/2020 1748      Complications: None; patient tolerated the procedure well.   Disposition: PACU - hemodynamically stable.   Condition: stable   Indications: The patient presented with a 3 day history of right-sided abdominal pain. A CT revealed findings consistent with acute appendicitis.   Procedure Details  Prior to the procedure, the risks, benefits, complications, treatment options, and expected outcomes were discussed with the patient and/or family, including but not limited to the risk of bleeding, infection, finding of a normal appendix, and the need for conversion to an open procedure. There was concurrence with the proposed plan and informed consent was obtained. The patient was taken to the operating room, identified as Gregory [name] and the procedure verified as Laproscopic Appendectomy.    The patient was placed in the supine position and general anesthesia was induced, along with placement of orogastric tube, SCD's, and a Foley catheter. The abdomen was prepped and draped in a sterile fashion. The abdomen was entered with Veress technique in the infraumbilical  incision. Intraperitoneal placement was confirmed with saline drop, low entry pressures, and easy insufflation. A 11 mm optiview trocar was placed under direct visualization with a 0 degree scope. The 10 mm 0 degree scope was placed in the abdomen and no evidence of injury was identified. A 12 mm port was placed in the left lower quadrant of the abdomen after skin incision with trocar placement under direct vision. A careful evaluation of the entire abdomen was carried out. An additional 5 mm port was placed in the suprapubic area under direct vision.  The patient was placed in Trendelenburg and left lateral decubitus position. The small intestines were retracted in the cephalad and left lateral direction away from the pelvis and right lower quadrant. The patient was found to have a dilated and inflamed appendix that was wrapping behind the cecum. Just about 2cm from the base of the appendix where the appendicolith was located on CT, there was signs of wall necrosis and perforation. The appendix perforation and purulence was contained behind the cecum.    The appendix was carefully dissected. A window was made in the mesoappendix at the base of the appendix. The appendix was divided at its base using a standard endo-GIA stapler. Minimal appendiceal stump was left in place. The mesoappendix was taken with the harmonic energy device. The appendix was placed within an Endocatch specimen bag. There was no evidence of bleeding, leakage, or complication after division of the appendix.  Any remaining blood or pus was suctioned out from the abdomen, hemostasis was confirmed. The pelvis was suctioned. The endocatch bag was removed via the 12 mm port, then the abdomen desufflated. The appendix was passed off the field as  a specimen.   The the 12 mm and 10 mm port sites were closed with a 0 Vicryl suture. The trocar site skin wounds were closed using subcuticular 4-0 Monocryl suture and dermabond. The patient was then  awakened from general anesthesia, extubated, and taken to PACU for recovery.   Instrument, sponge, and needle counts were correct at the conclusion of the case.   Algis Greenhouse, MD [facility] [address] [place], [place] [address] [phone]([id number])

## 2020-08-27 NOTE — Progress Notes (Signed)
[facility]  Updated wife. Perforated appendicitis. Zosyn ordered. Will keep until tolerating diet. Clears ordered. Orders released.  Algis Greenhouse, MD [facility] [address] [place], [place] [address] [phone] (office)

## 2020-08-27 NOTE — Anesthesia Postprocedure Evaluation (Signed)
Anesthesia Post Note  Patient: Gregory [name]  Procedure(s) Performed: LAPAROSCOPIC APPENDECTOMY (Abdomen)  Patient location during evaluation: PACU Anesthesia Type: General Level of consciousness: awake and alert, oriented and sedated Pain management: pain level controlled Vital Signs Assessment: post-procedure vital signs reviewed and stable Respiratory status: spontaneous breathing and respiratory function stable Cardiovascular status: blood pressure returned to baseline and stable Postop Assessment: no apparent nausea or vomiting Anesthetic complications: no   No notable events documented.   Last Vitals:  Vitals:   08/27/20 1842 08/27/20 1845  BP: (!) 106/57 110/66  Pulse: 96 95  Resp: (!) 27 (!) 26  Temp:    SpO2:  100%    Last Pain:  Vitals:   08/27/20 1835  TempSrc:   PainSc: Asleep                 Gianmarco Roye C Berley Gambrell

## 2020-08-27 NOTE — H&P (Signed)
[facility] History and Physical  Reason for Referral:Acute appendicitis  Referring Physician:  Dr. Jodi Mourning   Chief Complaint   Abdominal Pain     Gregory [name] is a 30 y.o. male.  HPI: Gregory [name] is a 30 yo with abdominal pain since Sunday that started periumbilical and radiated to the right after a few days.  The patient thought he had pulled a muscle and he took a flexeril and then he got nauseated and vomited on Monday. On Tuesday he then vomited, and decided to go to the ED.  He reports that he had no fevers at home but has started to have some low grade fevers here.  He came into the hospital and CT demonstrated appendicitis with no perforation and appendicolith.  Past Medical History:  Diagnosis Date   Acute hepatitis    ADHD (attention deficit hyperactivity disorder)    denies taking meds   Bipolar disorder (HCC)    Pt reports that this runs in his family but he has never been officially diagnosed. Pt reports that his mom used to give him Seroquel as a child but "made me feel like a zombie so I wouldn't take it".   Headache(784.0)    Pneumonia     Past Surgical History:  Procedure Laterality Date   ANTERIOR CRUCIATE LIGAMENT REPAIR Right 07/03/2013   Procedure: RECONSTRUCTION ANTERIOR CRUCIATE LIGAMENT (ACL) WITH HAMSTRING GRAFT;  Surgeon: [name], MD;  Location: [facility] OR;  Service: Orthopedics;  Laterality: Right;  RIGHT KNEE ANTERIOR CRUCIATE LIGAMENT RECONSTRUCTION, POSTERIOR CRUCIATE LIGAMENT RECONSTRUCTION, MENISCAL  DEBRIDEMENT, HAMSTRING AUTOGRAFT.   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 Pt reports that heart lining of cow was then attached so that tissue would expand as pt grew. Dr. Trey Sailors performed surgery.   FRACTURE SURGERY  2010   nasal fracture   IR US GUIDE BX ASP/DRAIN  11/23/2018   KNEE SURGERY     POSTERIOR CRUCIATE LIGAMENT RECONSTRUCTION Right  07/03/2013   Procedure: RECONSTRUCTION OF FIBULAR COLLATERAL LIGAMENT;  Surgeon: [name], MD;  Location: [facility] OR;  Service: Orthopedics;  Laterality: Right;    Family History  Problem Relation Age of Onset   Diabetes Mother    Chiari malformation Mother     Social History   Tobacco Use   Smoking status: Never   Smokeless tobacco: Never  Vaping Use   Vaping Use: Never used  Substance Use Topics   Alcohol use: No   Drug use: No    Medications: I have reviewed the patient's current medications. Prior to Admission:  Medications Prior to Admission  Medication Sig Dispense Refill Last Dose   ibuprofen (ADVIL) 200 MG tablet Take 200 mg by mouth every 6 (six) hours as needed.   Past Week   Scheduled:  Chlorhexidine Gluconate Cloth  6 each Topical Once   mupirocin ointment  1 application Nasal BID   pantoprazole (PROTONIX) IV  40 mg Intravenous QHS   Continuous:  [START ON 08/28/2020] cefoTEtan (CEFOTAN) IV     cefTRIAXone (ROCEPHIN)  IV 2 g (08/27/20 0051)   And   metronidazole 500 mg (08/27/20 1623)   lactated ringers 75 mL/hr at 08/27/20 0248   QIH:[id number] **OR** diphenhydrAMINE, morphine injection, ondansetron **OR** ondansetron (ZOFRAN) IV, oxyCODONE, simethicone  Allergies  Allergen Reactions   Acetaminophen     Hx of hepatitis      ROS:  A comprehensive review of systems was negative except for: Constitutional: positive for fevers Gastrointestinal: positive for abdominal pain, nausea, and vomiting  Blood pressure 126/76, pulse (!) 101, temperature 99.1 F (37.3 C), temperature source Oral, resp. rate 18, height 6' (1.829 m), weight 74.9 kg, SpO2 98 %. Physical Exam Vitals reviewed.  Constitutional:      Appearance: He is normal weight.  HENT:     Head: Normocephalic.  Eyes:     Extraocular Movements: Extraocular movements intact.  Cardiovascular:     Rate and Rhythm: Normal rate and regular rhythm.  Pulmonary:     Effort: Pulmonary  effort is normal.     Breath sounds: Normal breath sounds.  Abdominal:     General: There is distension.     Palpations: Abdomen is soft.     Tenderness: There is abdominal tenderness in the right lower quadrant, periumbilical area and suprapubic area.  Skin:    General: Skin is warm.  Neurological:     General: No focal deficit present.     Mental Status: He is alert and oriented to person, place, and time.  Psychiatric:        Mood and Affect: Mood normal.        Behavior: Behavior normal.    Results: Results for orders placed or performed during the hospital encounter of 08/26/20 (from the past 48 hour(s))  Urinalysis, Routine w reflex microscopic     Status: Abnormal   Collection Time: 08/26/20  7:58 PM  Result Value Ref Range   Color, Urine YELLOW YELLOW   APPearance CLEAR CLEAR   Specific Gravity, Urine 1.029 1.005 - 1.030   pH 7.0 5.0 - 8.0   Glucose, UA NEGATIVE NEGATIVE mg/dL   Hgb urine dipstick NEGATIVE NEGATIVE   Bilirubin Urine NEGATIVE NEGATIVE   Ketones, ur 5 (A) NEGATIVE mg/dL   Protein, ur 539 (A) NEGATIVE mg/dL   Nitrite NEGATIVE NEGATIVE   Leukocytes,Ua NEGATIVE NEGATIVE   RBC / HPF 0-5 0 - 5 RBC/hpf   WBC, UA 0-5 0 - 5 WBC/hpf   Bacteria, UA NONE SEEN NONE SEEN   Squamous Epithelial / LPF 0-5 0 - 5   Mucus PRESENT     Comment: Performed at [facility], [address], [place], [place] [address]  Lipase, blood     Status: None   Collection Time: 08/26/20  8:22 PM  Result Value Ref Range   Lipase 35 11 - 51 U/L    Comment: Performed at [facility], [address] [place], [place] [address]  Comprehensive metabolic panel     Status: Abnormal   Collection Time: 08/26/20  8:22 PM  Result Value Ref Range   Sodium 138 135 - 145 mmol/L   Potassium 4.5 3.5 - 5.1 mmol/L   Chloride 98 98 - 111 mmol/L   CO2 30 22 - 32 mmol/L   Glucose, Bld 117 (H) 70 - 99 mg/dL    Comment: Glucose reference range applies only to samples taken after fasting for at least 8  hours.   BUN 11 6 - 20 mg/dL   Creatinine, Ser 0.24 0.61 - 1.24 mg/dL   Calcium 9.9 8.9 - 09.7 mg/dL   Total Protein 8.2 (H) 6.5 - 8.1 g/dL   Albumin 4.6 3.5 - 5.0 g/dL   AST 20 15 - 41 U/L   ALT 36 0 - 44 U/L   Alkaline Phosphatase 135 (H) 38 - 126 U/L   Total Bilirubin 0.9 0.3 - 1.2 mg/dL  GFR, Estimated >60 >60 mL/min    Comment: (NOTE) Calculated using the CKD-EPI Creatinine Equation (2021)    Anion gap 10 5 - 15    Comment: Performed at [facility], [address] [place], [place] [address]  CBC     Status: Abnormal   Collection Time: 08/26/20  8:22 PM  Result Value Ref Range   WBC 17.4 (H) 4.0 - 10.5 K/uL   RBC 5.67 4.22 - 5.81 MIL/uL   Hemoglobin 16.0 13.0 - 17.0 g/dL   HCT 74.2 59.5 - 63.8 %   MCV 84.8 80.0 - 100.0 fL   MCH 28.2 26.0 - 34.0 pg   MCHC 33.3 30.0 - 36.0 g/dL   RDW 75.6 43.3 - 29.5 %   Platelets 315 150 - 400 K/uL   nRBC 0.0 0.0 - 0.2 %    Comment: Performed at [facility], [address], [place], [place] [address]  HIV Antibody (routine testing w rflx)     Status: None   Collection Time: 08/26/20  8:22 PM  Result Value Ref Range   HIV Screen 4th Generation wRfx Non Reactive Non Reactive    Comment: Performed at [facility], [address] [place], [place] [address]  Resp Panel by RT-PCR (Flu A&B, Covid) Nasopharyngeal Swab     Status: None   Collection Time: 08/26/20 11:20 PM   Specimen: Nasopharyngeal Swab; Nasopharyngeal(NP) swabs in vial transport medium  Result Value Ref Range   SARS Coronavirus 2 by RT PCR NEGATIVE NEGATIVE    Comment: (NOTE) SARS-CoV-2 target nucleic acids are NOT DETECTED.  The SARS-CoV-2 RNA is generally detectable in upper respiratory specimens during the acute phase of infection. The lowest concentration of SARS-CoV-2 viral copies this assay can detect is 138 copies/mL. A negative result does not preclude SARS-Cov-2 infection and should not be used as the sole basis for treatment or other patient management  decisions. A negative result may occur with  improper specimen collection/handling, submission of specimen other than nasopharyngeal swab, presence of viral mutation(s) within the areas targeted by this assay, and inadequate number of viral copies(<138 copies/mL). A negative result must be combined with clinical observations, patient history, and epidemiological information. The expected result is Negative.  Fact Sheet for Patients:  BloggerCourse.com  Fact Sheet for Healthcare Providers:  SeriousBroker.it  This test is no t yet approved or cleared by the [place] FDA and  has been authorized for detection and/or diagnosis of SARS-CoV-2 by FDA under an Emergency Use Authorization (EUA). This EUA will remain  in effect (meaning this test can be used) for the duration of the COVID-19 declaration under Section 564(b)(1) of the Act, 21 U.S.C.section 360bbb-3(b)(1), unless the authorization is terminated  or revoked sooner.       Influenza A by PCR NEGATIVE NEGATIVE   Influenza B by PCR NEGATIVE NEGATIVE    Comment: (NOTE) The Xpert Xpress SARS-CoV-2/FLU/RSV plus assay is intended as an aid in the diagnosis of influenza from Nasopharyngeal swab specimens and should not be used as a sole basis for treatment. Nasal washings and aspirates are unacceptable for Xpert Xpress SARS-CoV-2/FLU/RSV testing.  Fact Sheet for Patients: BloggerCourse.com  Fact Sheet for Healthcare Providers: SeriousBroker.it  This test is not yet approved or cleared by the [place] FDA and has been authorized for detection and/or diagnosis of SARS-CoV-2 by FDA under an Emergency Use Authorization (EUA). This EUA will remain in effect (meaning this test can be used) for the duration of the COVID-19  declaration under Section 564(b)(1) of the Act, 21 U.S.C. section 360bbb-3(b)(1), unless the authorization  is terminated or revoked.  Performed at [facility], [address] [place], [place] [address]   Surgical PCR screen     Status: None   Collection Time: 08/27/20  1:08 AM   Specimen: Nasal Mucosa; Nasal Swab  Result Value Ref Range   MRSA, PCR NEGATIVE NEGATIVE   Staphylococcus aureus NEGATIVE NEGATIVE    Comment: (NOTE) The Xpert SA Assay (FDA approved for NASAL specimens in patients 29 years of age and older), is one component of a comprehensive surveillance program. It is not intended to diagnose infection nor to guide or monitor treatment. Performed at [facility], [address] [place], [place] [address]     Personally reviewed and dilated and inflamed appendix, appendicolith, no signs of perforation  CT ABDOMEN PELVIS W CONTRAST  Result Date: 08/26/2020 CLINICAL DATA:  Generalized abdominal pain, nausea and vomiting since yesterday, history of liver failure EXAM: CT ABDOMEN AND PELVIS WITH CONTRAST TECHNIQUE: Multidetector CT imaging of the abdomen and pelvis was performed using the standard protocol following bolus administration of intravenous contrast. CONTRAST:  100mL OMNIPAQUE IOHEXOL 300 MG/ML  SOLN COMPARISON:  11/22/2018 FINDINGS: Lower chest: No acute pleural or parenchymal lung disease. Hepatobiliary: No focal liver abnormality is seen. No gallstones, gallbladder wall thickening, or biliary dilatation. Pancreas: Unremarkable. No pancreatic ductal dilatation or surrounding inflammatory changes. Spleen: Normal in size without focal abnormality. Adrenals/Urinary Tract: The adrenals are unremarkable. 5 mm nonobstructing right renal calculus. Left kidney is unremarkable. The bladder is minimally distended with no focal abnormalities. Stomach/Bowel: No bowel obstruction or ileus. A dilated inflamed appendix is seen in the right lower quadrant measuring up to 15 mm in diameter. There is extensive mural thickening and periappendiceal fat stranding. 12 mm appendicolith is seen within  the proximal appendix. No evidence of perforation, fluid collection, or abscess. There is extensive mesenteric edema and small volume free fluid in the right lower quadrant. Vascular/Lymphatic: No significant vascular findings are present. No enlarged abdominal or pelvic lymph nodes. Subcentimeter lymph nodes in the right lower quadrant mesentery are reactive. Reproductive: Prostate is unremarkable. Other: Small volume free fluid right lower quadrant as above. No evidence of pneumoperitoneum. No abdominal wall hernia. Musculoskeletal: No acute or destructive bony lesions. Reconstructed images demonstrate no additional findings. IMPRESSION: 1. Acute uncomplicated appendicitis. Significant periappendiceal inflammatory changes without perforation, fluid collection, or abscess. 2. Nonobstructing 5 mm right renal calculus. Electronically Signed   By: Sharlet SalinaMichael  Brown M.D.   On: 08/26/2020 22:58     Assessment & Plan:  Gregory [name] is a 30 y.o. male with acute appendicitis. He is having worse pain and some fevers.  -Discussed the risk of laparoscopic appendectomy and the option of antibiotics alone. Discussed that in [place] and some trials in the [place], antibiotics are used for simple appendicitis. Discussed that research shows a 40% failure rate for antibiotics alone.  Discussed risk of surgery including but not limited to bleeding, infection, injury to other organs, normal appendix, and after this discussion the patient has decided to proceed with surgery.  COVID negative  All questions were answered to the satisfaction of the patient and family.    Gregory [name] 08/27/2020, 5:09 PM

## 2020-08-27 NOTE — Transfer of Care (Signed)
Immediate Anesthesia Transfer of Care Note  Patient: Gregory [name]  Procedure(s) Performed: LAPAROSCOPIC APPENDECTOMY (Abdomen)  Patient Location: PACU  Anesthesia Type:General  Level of Consciousness: sedated and drowsy  Airway & Oxygen Therapy: Patient Spontanous Breathing and Patient connected to face mask oxygen  Post-op Assessment: Report given to RN and Post -op Vital signs reviewed and stable  Post vital signs: Reviewed and stable  Last Vitals:  Vitals Value Taken Time  BP 106/57 08/27/20 1842  Temp 37.3 C 08/27/20 1835  Pulse 99 08/27/20 1843  Resp 26 08/27/20 1843  SpO2 100 % 08/27/20 1843  Vitals shown include unvalidated device data.  Last Pain:  Vitals:   08/27/20 1835  TempSrc:   PainSc: Asleep         Complications: No notable events documented.

## 2020-08-27 NOTE — Anesthesia Procedure Notes (Signed)
Procedure Name: Intubation Date/Time: 08/27/2020 5:27 PM Performed by: Brynda Peon, CRNA Pre-anesthesia Checklist: Patient identified, Emergency Drugs available, Suction available, Patient being monitored and Timeout performed Patient Re-evaluated:Patient Re-evaluated prior to induction Oxygen Delivery Method: Circle system utilized Preoxygenation: Pre-oxygenation with 100% oxygen Induction Type: IV induction Laryngoscope Size: Miller and 3 Grade View: Grade I Tube type: Oral Tube size: 7.5 mm Number of attempts: 1 Airway Equipment and Method: Stylet Placement Confirmation: ETT inserted through vocal cords under direct vision, positive ETCO2, CO2 detector and breath sounds checked- equal and bilateral Secured at: 23 cm Tube secured with: Tape Dental Injury: Teeth and Oropharynx as per pre-operative assessment

## 2020-08-27 NOTE — Progress Notes (Signed)
Pharmacy Antibiotic Note  LARONE KLIETHERMES is a 30 y.o. male admitted on 08/26/2020 with  Intra-abdominal infection .  Pharmacy has been consulted for Zosyn dosing.  Plan: Zosyn 3.375g IV q8h (4 hour infusion). Monitor labs, c/s, and patient improvement.  Height: 6' (182.9 cm) Weight: 74.9 kg (165 lb 2 oz) IBW/kg (Calculated) : 77.6  Temp (24hrs), Avg:99.6 F (37.6 C), Min:98.6 F (37 C), Max:101.1 F (38.4 C)  Recent Labs  Lab 08/26/20 2022  WBC 17.4*  CREATININE 0.93    Estimated Creatinine Clearance: 124.2 mL/min (by C-G formula based on SCr of 0.93 mg/dL).    Allergies  Allergen Reactions   Acetaminophen     Hx of hepatitis     Antimicrobials this admission: Zosyn 7/20 >>  CTX/Flagyl 7/20  Microbiology results: COVID negative  Thank you for allowing pharmacy to be a part of this patient's care.  Tad Moore 08/27/2020 6:35 PM

## 2020-08-27 NOTE — Anesthesia Preprocedure Evaluation (Addendum)
Anesthesia Evaluation  Patient identified by MRN, date of birth, ID band Patient awake    Reviewed: Allergy & Precautions, NPO status , Patient's Chart, lab work & pertinent test results  History of Anesthesia Complications Negative for: history of anesthetic complications  Airway Mallampati: II  TM Distance: >3 FB Neck ROM: Full    Dental  (+) Dental Advisory Given, Teeth Intact   Pulmonary pneumonia,    Pulmonary exam normal breath sounds clear to auscultation       Cardiovascular Exercise Tolerance: Good Normal cardiovascular exam Rhythm:Regular Rate:Normal     Neuro/Psych  Headaches, PSYCHIATRIC DISORDERS Bipolar Disorder  Neuromuscular disease (chiari malformation repaired)    GI/Hepatic negative GI ROS, (+) Hepatitis -, Toxin Related  Endo/Other  negative endocrine ROS  Renal/GU negative Renal ROS     Musculoskeletal   Abdominal   Peds  Hematology negative hematology ROS (+)   Anesthesia Other Findings   Reproductive/Obstetrics negative OB ROS                            Anesthesia Physical Anesthesia Plan  ASA: 2  Anesthesia Plan: General   Post-op Pain Management:    Induction: Intravenous  PONV Risk Score and Plan: 4 or greater and Ondansetron, Midazolam and Dexamethasone  Airway Management Planned: Oral ETT  Additional Equipment:   Intra-op Plan:   Post-operative Plan: Extubation in OR  Informed Consent: I have reviewed the patients History and Physical, chart, labs and discussed the procedure including the risks, benefits and alternatives for the proposed anesthesia with the patient or authorized representative who has indicated his/her understanding and acceptance.     Dental advisory given  Plan Discussed with: Surgeon  Anesthesia Plan Comments:         Anesthesia Quick Evaluation

## 2020-08-28 ENCOUNTER — Encounter (HOSPITAL_COMMUNITY): Payer: Self-pay | Admitting: General Surgery

## 2020-08-28 MED ORDER — TRAMADOL HCL 50 MG PO TABS: 50.0000 mg | ORAL_TABLET | Freq: Four times a day (QID) | ORAL | 0 refills | Status: DC | PRN

## 2020-08-28 MED ORDER — AMOXICILLIN-POT CLAVULANATE 875-125 MG PO TABS: 1.0000 | ORAL_TABLET | Freq: Two times a day (BID) | ORAL | 0 refills | Status: AC

## 2020-08-28 MED ORDER — ONDANSETRON 4 MG PO TBDP: 4.0000 mg | ORAL_TABLET | Freq: Four times a day (QID) | ORAL | 0 refills | Status: DC | PRN

## 2020-08-28 NOTE — Progress Notes (Signed)
Patient has been given discharge papers, work note, and IV has been removed. Patient did not want a wheelchair and walked himself out.

## 2020-08-28 NOTE — Discharge Instructions (Addendum)
Discharge Laparoscopic Surgery Instructions:  Common Complaints: Right shoulder pain is common after laparoscopic surgery. This is secondary to the gas used in the surgery being trapped under the diaphragm.  Walk to help your body absorb the gas. This will improve in a few days. Pain at the port sites are common, especially the larger port sites. This will improve with time.  Some nausea is common and poor appetite. The main goal is to stay hydrated the first few days after surgery.   Diet/ Activity: Diet as tolerated. You may not have an appetite, but it is important to stay hydrated. Drink 64 ounces of water a day. Your appetite will return with time.  Shower per your regular routine daily.  Do not take hot showers. Take warm showers that are less than 10 minutes. Rest and listen to your body, but do not remain in bed all day.  Walk everyday for at least 15-20 minutes. Deep cough and move around every 1-2 hours in the first few days after surgery.  Do not lift > 10 lbs, perform excessive bending, pushing, pulling, squatting for 1-2 weeks after surgery.  Do not pick at the dermabond glue on your incision sites.  This glue film will remain in place for 1-2 weeks and will start to peel off.  Do not place lotions or balms on your incision unless instructed to specifically by Dr. Henreitta Leber.   Pain Expectations and Narcotics: -After surgery you will have pain associated with your incisions and this is normal. The pain is muscular and nerve pain, and will get better with time. -You are encouraged and expected to take non narcotic medications like tylenol and ibuprofen (when able) to treat pain as multiple modalities can aid with pain treatment. -Narcotics are only used when pain is severe or there is breakthrough pain. -You are not expected to have a pain score of 0 after surgery, as we cannot prevent pain. A pain score of 3-4 that allows you to be functional, move, walk, and tolerate some activity is  the goal. The pain will continue to improve over the days after surgery and is dependent on your surgery. -Due to  law, we are only able to give a certain amount of pain medication to treat post operative pain, and we only give additional narcotics on a patient by patient basis.  -For most laparoscopic surgery, studies have shown that the majority of patients only need 10-15 narcotic pills, and for open surgeries most patients only need 15-20.   -Having appropriate expectations of pain and knowledge of pain management with non narcotics is important as we do not want anyone to become addicted to narcotic pain medication.  -Using ice packs in the first 48 hours and heating pads after 48 hours, wearing an abdominal binder (when recommended), and using over the counter medications are all ways to help with pain management.   -Simple acts like meditation and mindfulness practices after surgery can also help with pain control and research has proven the benefit of these practices.  Medication: Take your antibiotic until completed.  Take tylenol and ibuprofen as needed for pain control, alternating every 4-6 hours.  Example:  Tylenol 1000mg  @ 6am, 12noon, 6pm, (Do not exceed 4000mg  of tylenol a day). Ibuprofen 800mg  @ 9am, 3pm, 9pm, 3am (Do not exceed 3600mg  of ibuprofen a day).  Take tramadol for breakthrough pain. Take Colace for constipation related to narcotic pain medication. If you do not have a bowel movement in 2 days,  take Miralax over the counter.  Drink plenty of water to also prevent constipation.   Contact Information: If you have questions or concerns, please call our office, [phone], Monday- Thursday 8AM-5PM and Friday 8AM-12Noon.  If it is after hours or on the weekend, please call [facility] Main [facility], [phone], [phone], and ask to speak to the surgeon on call for Dr. Juliene Kirsh at [place].   

## 2020-08-28 NOTE — Discharge Summary (Signed)
Physician Discharge Summary  Patient ID: Gregory [name] MRN: [id number] DOB/AGE: May 05, 1990 30 y.o.  Admit date: 08/26/2020 Discharge date: 08/28/2020  Admission Diagnoses: Acute appendicitis   Discharge Diagnoses:  Principal Problem:   Acute perforated appendicitis   Discharged Condition: good  Hospital Course: Gregory [name] is a 30 yo who came into the hospital with pain for the past 2 days and findings of appendicitis on CT. He was admitted with antibiotics and taken to the OR the next day. He was found to have a perforated appendicitis and was kept overnight for monitoring and to ensure he tolerated a diet and had pain control. He was discharged home with oral antibiotics. He was eating, drinking, and feeling much better at discharge.   Consults: None  Significant Diagnostic Studies: CT with appendicitis and appendicolith  Treatments: IV hydration, antibiotics: Zosyn, and laparoscopic appendectomy 08/27/2020  Discharge Exam: Blood pressure 113/69, pulse 66, temperature 98.1 F (36.7 C), temperature source Oral, resp. rate 18, height 6' (1.829 m), weight 74.9 kg, SpO2 99 %. General appearance: alert, cooperative, and no distress Normal work of breathing Soft, appropriately tender, port sites c/d/I with no erythema or drainage  Disposition: Discharge disposition: 01-Home or Self Care      Discharge Instructions     Call MD for:  difficulty breathing, headache or visual disturbances   Complete by: As directed    Call MD for:  extreme fatigue   Complete by: As directed    Call MD for:  persistant dizziness or light-headedness   Complete by: As directed    Call MD for:  persistant nausea and vomiting   Complete by: As directed    Call MD for:  redness, tenderness, or signs of infection (pain, swelling, redness, odor or green/yellow discharge around incision site)   Complete by: As directed    Call MD for:  severe uncontrolled pain   Complete by: As directed    Call MD  for:  temperature >100.4   Complete by: As directed    Increase activity slowly   Complete by: As directed       Allergies as of 08/28/2020       Reactions   Acetaminophen    Hx of hepatitis        Medication List     TAKE these medications    amoxicillin-clavulanate 875-125 MG tablet Commonly known as: Augmentin Take 1 tablet by mouth 2 (two) times daily for 5 days.   ibuprofen 200 MG tablet Commonly known as: ADVIL Take 200 mg by mouth every 6 (six) hours as needed.   ondansetron 4 MG disintegrating tablet Commonly known as: ZOFRAN-ODT Take 1 tablet (4 mg total) by mouth every 6 (six) hours as needed for nausea.   traMADol 50 MG tablet Commonly known as: ULTRAM Take 1 tablet (50 mg total) by mouth every 6 (six) hours as needed for moderate pain.        Follow-up Information     Lucretia Roers, MD Follow up on 09/23/2020.   Specialty: General Surgery Why: post op phone call; if you need to be seen in person call the office Contact information: [address] [facility] [address] [phone]                 Signed: Lucretia Roers

## 2020-08-29 LAB — SURGICAL PATHOLOGY

## 2020-09-23 ENCOUNTER — Encounter: Payer: Self-pay | Admitting: General Surgery

## 2020-10-30 IMAGING — CT CT ANGIO CHEST
2 of 6 series · 18 of 46 positions shown · IV contrast (APPLIED)
Comparison: Chest radiograph 11/06/2018

CLINICAL DATA: Cough and congestion for 7 days, on antibiotics,
fever, dyspnea, PE suspected with positive D-dimer

EXAM:
CT ANGIOGRAPHY CHEST WITH CONTRAST
TECHNIQUE: Multidetector CT imaging of the chest was performed using the
standard protocol during bolus administration of intravenous
contrast. Multiplanar CT image reconstructions and MIPs were
obtained to evaluate the vascular anatomy.
CONTRAST:  100mL OMNIPAQUE IOHEXOL 350 MG/ML SOLN

[Series 5: thins · axial · 0.67mm/px · z∈[-151,+101]mm · 16 of 276 slices shown]
[im 12/276  lung]
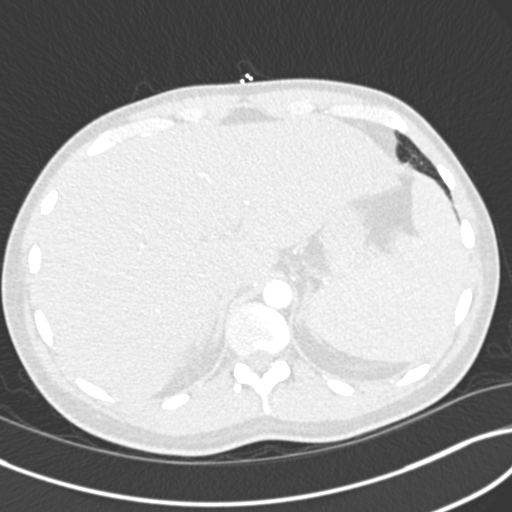
[im 36/276  soft-tissue]
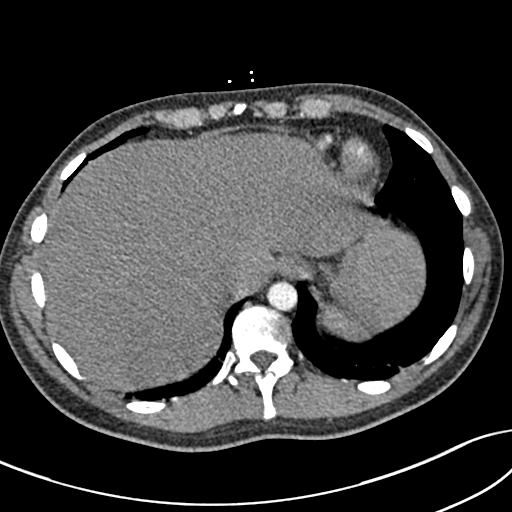
[im 48/276  lung]
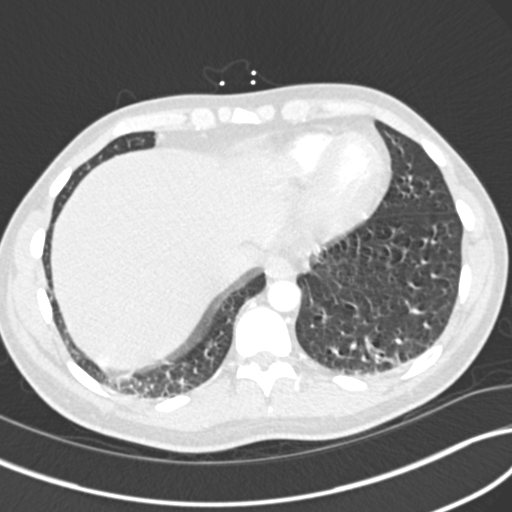
[im 60/276  soft-tissue]
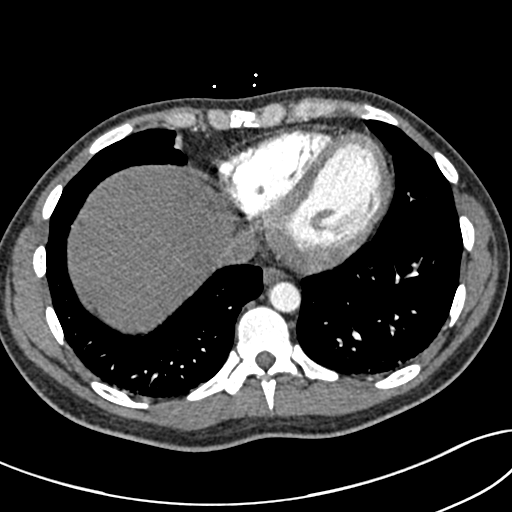
[im 84/276  lung]
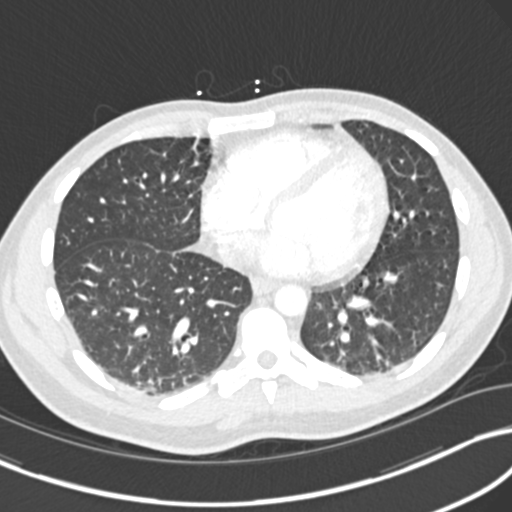
[im 96/276  soft-tissue]
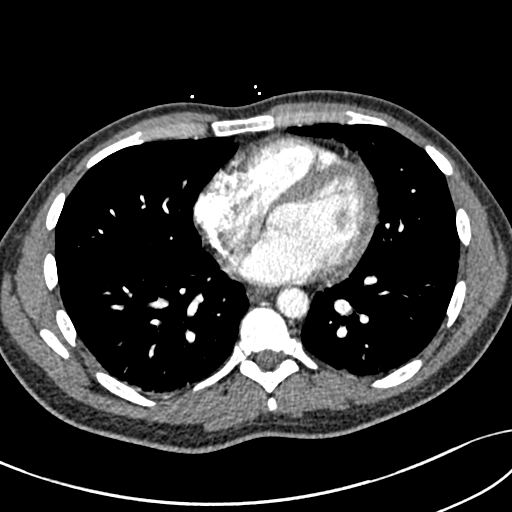
[im 108/276  lung]
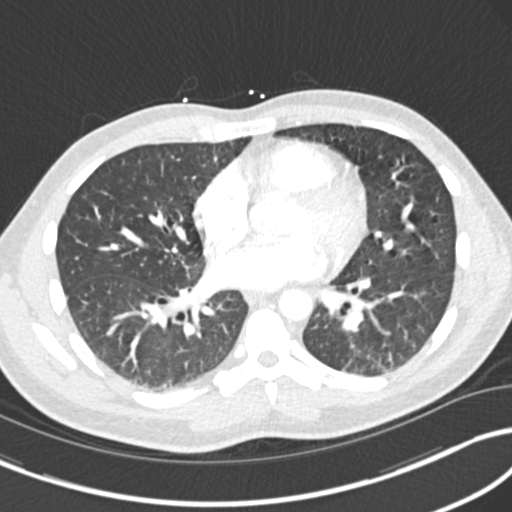
[im 132/276  soft-tissue]
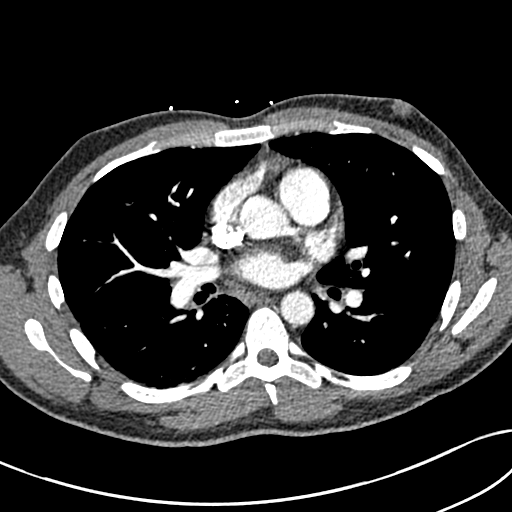
[im 144/276  lung]
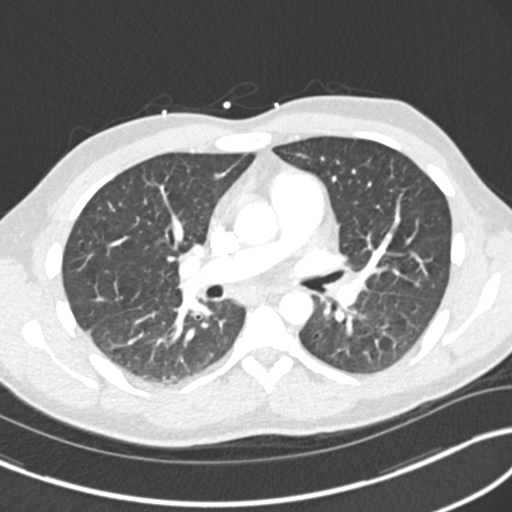
[im 168/276  soft-tissue]
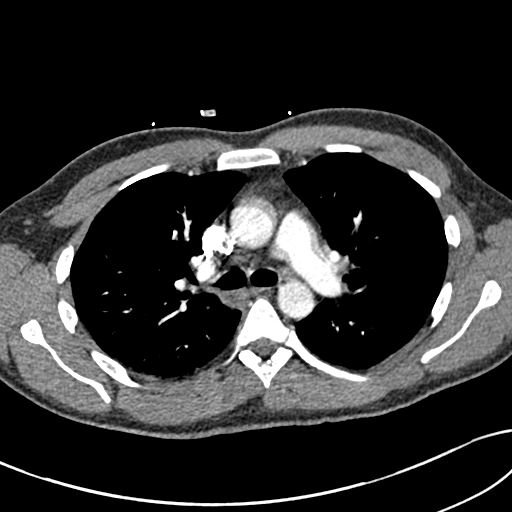
[im 180/276  lung]
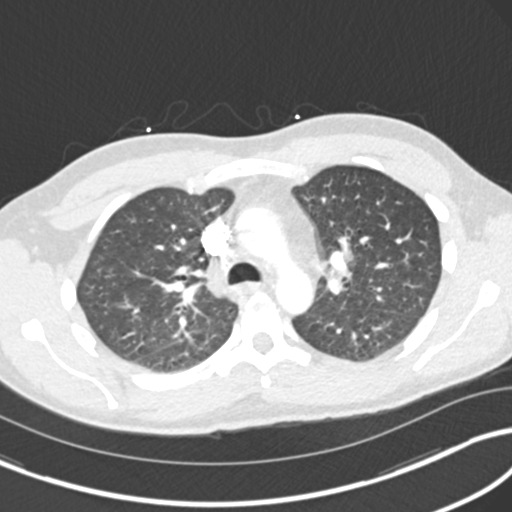
[im 192/276  soft-tissue]
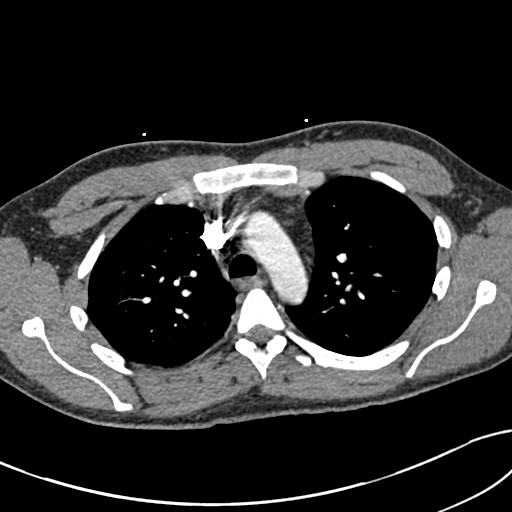
[im 216/276  lung]
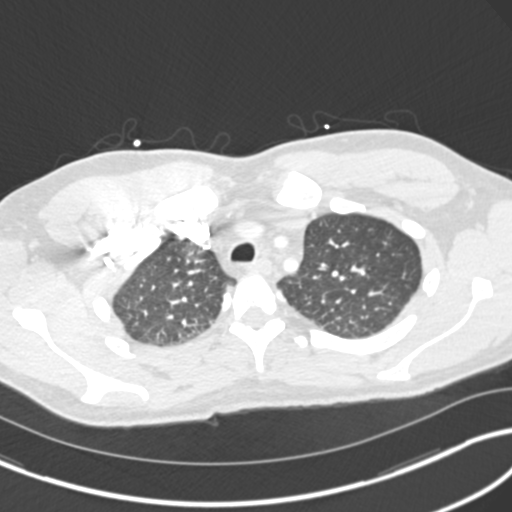
[im 228/276  soft-tissue]
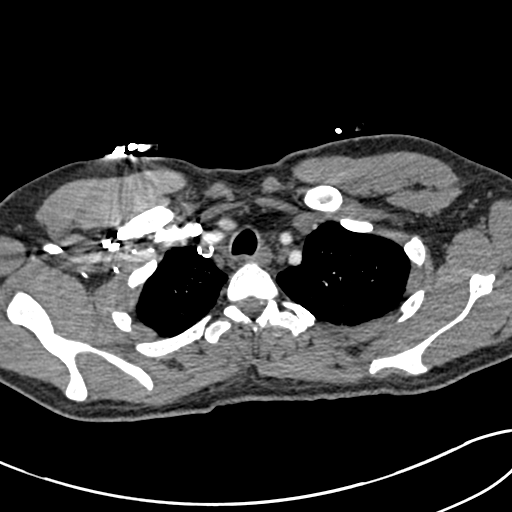
[im 240/276  lung]
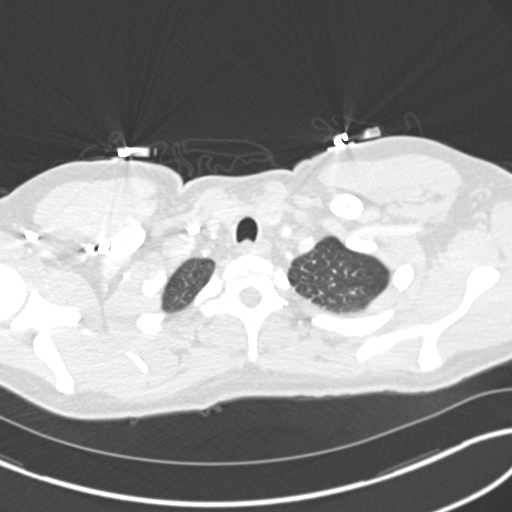
[im 264/276  soft-tissue]
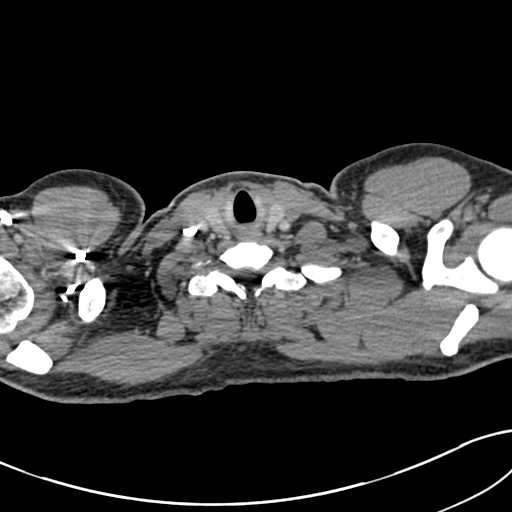

[Series 7: coronal mpr · coronal · 0.54mm/px · 2 of 75 slices shown]
[im 25/75  soft-tissue]
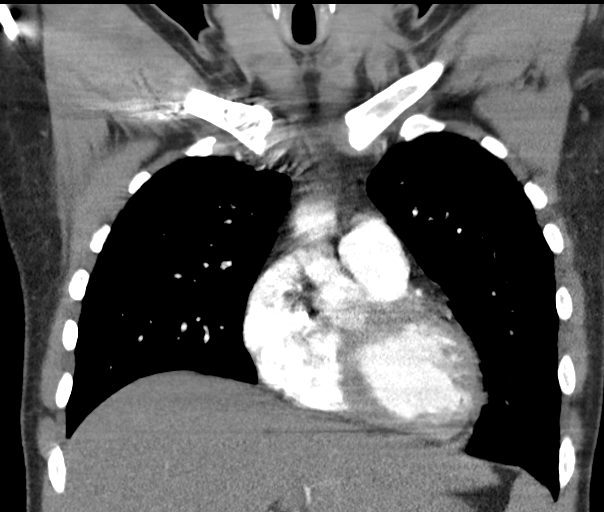
[im 50/75  soft-tissue]
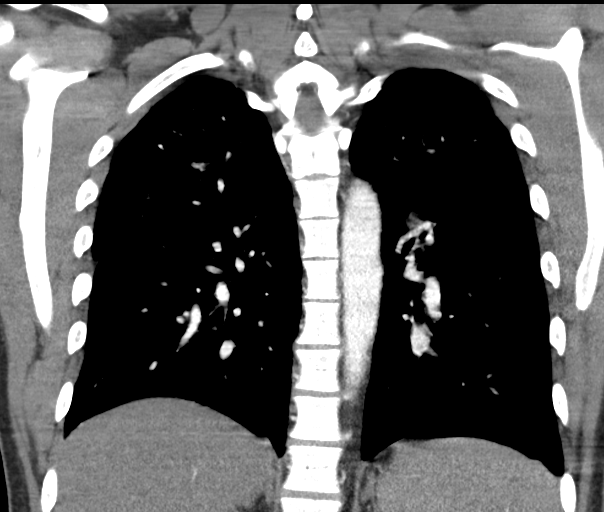

[18 of 46 positions shown; findings below may reference images not displayed]

FINDINGS: Cardiovascular: Satisfactory opacification of the pulmonary arteries
to the segmental level. No evidence of pulmonary embolism. No
central pulmonary artery enlargement. No CT evidence of right heart
strain. Normal heart size. No pericardial effusion. The aorta is
normal caliber. No luminal irregularity or periaortic stranding.
Common origin of the brachiocephalic and left common carotid artery.
Proximal great vessels and subclavian arteries are unremarkable.

Mediastinum/Nodes: No enlarged mediastinal or axillary lymph nodes.
Thyroid gland, trachea, and esophagus demonstrate no significant
findings.

Lungs/Pleura: Dependent atelectasis is noted posteriorly. More
bandlike areas of atelectasis or scarring are present in the bases.
No consolidation, features of edema, pneumothorax, or effusion. No
suspicious pulmonary nodules or masses.

Upper Abdomen: No acute abnormalities present in the visualized
portions of the upper abdomen.

Musculoskeletal: No acute osseous abnormality or suspicious osseous
lesion. Mild bilateral gynecomastia. No suspicious chest wall
lesions.

Review of the MIP images confirms the above findings.
IMPRESSION: No evidence of pulmonary embolism. No acute intrathoracic process.

## 2020-10-31 IMAGING — NM NM HEPATOBILIARY IMAGE, INC GB
2 series · 12 of 12 positions shown · non-contrast
Comparison: Ultrasound 11/06/2018

CLINICAL DATA: Elevated bilirubin. Mild gallbladder wall thickening
on ultrasound.

EXAM:
NUCLEAR MEDICINE HEPATOBILIARY IMAGING
TECHNIQUE: Sequential images of the abdomen were obtained [DATE] minutes
following intravenous administration of radiopharmaceutical.
RADIOPHARMACEUTICALS:  7.5 mCi Gc-NNm  Choletec IV

[Series 1: raw data · 4.46mm/px · 6 of 30 frames shown (1 of 2)]
[frame 3/30]
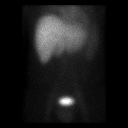
[frame 8/30]
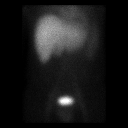
[frame 13/30]
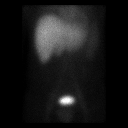
[frame 18/30]
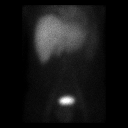
[frame 23/30]
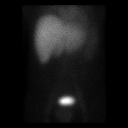
[frame 28/30]
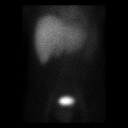

[Series 1: raw data · 4.46mm/px · 6 of 60 frames shown (2 of 2)]
[frame 6/60]
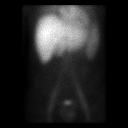
[frame 16/60]
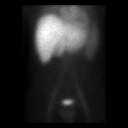
[frame 26/60]
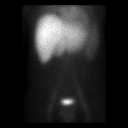
[frame 36/60]
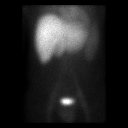
[frame 46/60]
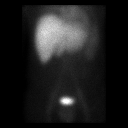
[frame 56/60]
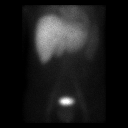

[12 of 12 positions shown; findings below may reference images not displayed]

FINDINGS: Delayed clearance of radiotracer from the blood pool related to
elevated bilirubin. There is uniform uptake within the liver.
Imaging performed over 90 minutes with no transit of radiotracer
into the bowel. Gallbladder filling cannot be assessed.
IMPRESSION: Delayed/impaired transit of radiotracer from the liver into the
bowel. Differential would include hepatic dysfunction (such as
hepatitis) versus biliary obstruction. Consider MRCP for further
evaluation.

Gallbladder filling cannot be assessed with out a excretion of
radiotracer into extrahepatic ducts.

Image performed on 11/07/2018 at 11 a.m. Images not presented for
interpretation until 11/08/2018 at 9 a.m. Computer interface
technical difficulty.

Findings conveyed to [place], PA on 11/08/2018  at[DATE].

## 2023-03-25 ENCOUNTER — Emergency Department (HOSPITAL_COMMUNITY)
Admission: EM | Admit: 2023-03-25 | Discharge: 2023-03-25 | Disposition: A | Discharge status: Home / Self Care | Payer: BC Managed Care – PPO | Attending: Emergency Medicine | Admitting: Emergency Medicine

## 2023-03-25 ENCOUNTER — Other Ambulatory Visit: Payer: Self-pay

## 2023-03-25 ENCOUNTER — Emergency Department (HOSPITAL_COMMUNITY): Payer: BC Managed Care – PPO

## 2023-03-25 ENCOUNTER — Encounter (HOSPITAL_COMMUNITY): Payer: Self-pay

## 2023-03-25 DIAGNOSIS — N50811 Right testicular pain: Secondary | ICD-10-CM | POA: Diagnosis present

## 2023-03-25 DIAGNOSIS — N433 Hydrocele, unspecified: Secondary | ICD-10-CM | POA: Insufficient documentation

## 2023-03-25 DIAGNOSIS — N3000 Acute cystitis without hematuria: Secondary | ICD-10-CM | POA: Diagnosis not present

## 2023-03-25 LAB — URINALYSIS, ROUTINE W REFLEX MICROSCOPIC
Bacteria, UA: NONE SEEN
Bilirubin Urine: NEGATIVE
Glucose, UA: NEGATIVE mg/dL
Ketones, ur: NEGATIVE mg/dL
Leukocytes,Ua: NEGATIVE
Nitrite: NEGATIVE
Protein, ur: NEGATIVE mg/dL
Specific Gravity, Urine: 1.018 (ref 1.005–1.030)
pH: 7 (ref 5.0–8.0)

## 2023-03-25 LAB — COMPREHENSIVE METABOLIC PANEL
ALT: 27 U/L (ref 0–44)
AST: 25 U/L (ref 15–41)
Albumin: 3.9 g/dL (ref 3.5–5.0)
Alkaline Phosphatase: 79 U/L (ref 38–126)
Anion gap: 6 (ref 5–15)
BUN: 13 mg/dL (ref 6–20)
CO2: 25 mmol/L (ref 22–32)
Calcium: 9.1 mg/dL (ref 8.9–10.3)
Chloride: 105 mmol/L (ref 98–111)
Creatinine, Ser: 1 mg/dL (ref 0.61–1.24)
GFR, Estimated: >60 mL/min (ref >60)
Glucose, Bld: 107 mg/dL — ABNORMAL HIGH (ref 70–99)
Potassium: 4.2 mmol/L (ref 3.5–5.1)
Sodium: 136 mmol/L (ref 135–145)
Total Bilirubin: 0.7 mg/dL (ref 0.0–1.2)
Total Protein: 6.3 g/dL — ABNORMAL LOW (ref 6.5–8.1)

## 2023-03-25 LAB — CBC
HCT: 44.1 % (ref 39.0–52.0)
Hemoglobin: 14.8 g/dL (ref 13.0–17.0)
MCH: 27.9 pg (ref 26.0–34.0)
MCHC: 33.6 g/dL (ref 30.0–36.0)
MCV: 83.2 fL (ref 80.0–100.0)
Platelets: 274 10*3/uL (ref 150–400)
RBC: 5.3 MIL/uL (ref 4.22–5.81)
RDW: 11.9 % (ref 11.5–15.5)
WBC: 8.5 10*3/uL (ref 4.0–10.5)
nRBC: 0 % (ref 0.0–0.2)

## 2023-03-25 MED ORDER — CEPHALEXIN 500 MG PO CAPS: 500.0000 mg | ORAL_CAPSULE | Freq: Three times a day (TID) | ORAL | 0 refills | Status: AC

## 2023-03-25 NOTE — ED Triage Notes (Signed)
Pt c/o testicular pain that is worse when he is trying to sleep or feels hot x 6 years. Pt states he can feel a knot behind testicles. Pt has been seen for same but pain is worse since appendix was taken out two years ago.

## 2023-03-25 NOTE — ED Provider Notes (Signed)
[place] [facility] AT [facility] Provider Note   CSN: [id number] Arrival date & time: 03/25/23  0815     History  Chief Complaint  Patient presents with   Testicle Pain    Gregory [name] is a 33 y.o. male.  HPI    6-7 years ago has had episodes of testicular pain Had [place] many years ago Used to be one time a year, now is about once a month, worse at night when laying down Go to work and moving doesn't notice but when sitting it hurts, feels like hit in testicle and pain doesn't go away No urinary symptoms, has had kidney stones in past, last time had one during appendix surgery Felt like pain worse after appendix surgery. No abdominal pain No fever No constipation or diarrhea Had back pain Sunday, has not had it, did have an episode yesterday.  Past Medical History:  Diagnosis Date   Acute hepatitis    ADHD (attention deficit hyperactivity disorder)    denies taking meds   Bipolar disorder (HCC)    Pt reports that this runs in his family but he has never been officially diagnosed. Pt reports that his mom used to give him Seroquel as a child but "made me feel like a zombie so I wouldn't take it".   Headache(784.0)    Pneumonia      Home Medications Prior to Admission medications   Medication Sig Start Date End Date Taking? Authorizing Provider  cephALEXin (KEFLEX) 500 MG capsule Take 1 capsule (500 mg total) by mouth 3 (three) times daily for 7 days. 03/25/23 04/01/23 Yes Alvira Monday, MD  ibuprofen (ADVIL) 200 MG tablet Take 200 mg by mouth every 6 (six) hours as needed.    [provider]  ondansetron (ZOFRAN-ODT) 4 MG disintegrating tablet Take 1 tablet (4 mg total) by mouth every 6 (six) hours as needed for nausea. 08/28/20   Lucretia Roers, MD  traMADol (ULTRAM) 50 MG tablet Take 1 tablet (50 mg total) by mouth every 6 (six) hours as needed for moderate pain. 08/28/20   Lucretia Roers, MD      Allergies    Acetaminophen     Review of Systems   Review of Systems  Physical Exam Updated Vital Signs BP 126/85 (BP Location: Right Arm)   Pulse 70   Temp (!) 97.5 F (36.4 C) (Oral)   Resp 16   Ht 6' (1.829 m)   Wt 79.4 kg   SpO2 100%   BMI 23.73 kg/m  Physical Exam Vitals and nursing note reviewed.  Constitutional:      General: He is not in acute distress.    Appearance: He is well-developed. He is not diaphoretic.  HENT:     Head: Normocephalic and atraumatic.  Eyes:     Conjunctiva/sclera: Conjunctivae normal.  Cardiovascular:     Rate and Rhythm: Normal rate and regular rhythm.     Heart sounds: Normal heart sounds. No murmur heard.    No friction rub. No gallop.  Pulmonary:     Effort: Pulmonary effort is normal. No respiratory distress.     Breath sounds: Normal breath sounds. No wheezing or rales.  Abdominal:     General: There is no distension.     Palpations: Abdomen is soft.     Tenderness: There is no abdominal tenderness. There is no guarding.     Hernia: There is no hernia in the left inguinal area or right inguinal area.  Genitourinary:    Testes:        Right: Tenderness (mild) present. Cremasteric reflex is present.         Left: Cremasteric reflex is present.      Epididymis:     Right: No tenderness.     Left: No tenderness.  Musculoskeletal:     Cervical back: Normal range of motion.  Skin:    General: Skin is warm and dry.  Neurological:     Mental Status: He is alert and oriented to person, place, and time.     ED Results / Procedures / Treatments   Labs (all labs ordered are listed, but only abnormal results are displayed) Labs Reviewed  COMPREHENSIVE METABOLIC PANEL - Abnormal; Notable for the following components:      Result Value   Glucose, Bld 107 (*)    Total Protein 6.3 (*)    All other components within normal limits  URINALYSIS, ROUTINE W REFLEX MICROSCOPIC - Abnormal; Notable for the following components:   Hgb urine dipstick MODERATE (*)     All other components within normal limits  URINE CULTURE  CBC  GC/CHLAMYDIA PROBE AMP ([place]) NOT AT [facility]    EKG None  Radiology US SCROTUM W/DOPPLER Result Date: 03/25/2023 CLINICAL DATA:  testicle pain. EXAM: SCROTAL ULTRASOUND DOPPLER ULTRASOUND OF THE TESTICLES TECHNIQUE: Complete ultrasound examination of the testicles, epididymis, and other scrotal structures was performed. Color and spectral Doppler ultrasound were also utilized to evaluate blood flow to the testicles. COMPARISON:  Ultrasound from 11/08/2017. FINDINGS: Right testicle Measurements: 2.8 x 3.1 x 5.5 cm. No mass or microlithiasis visualized. Slightly heterogeneous attenuation of the testicles is nonspecific but may represent sequela of remote infection or inflammation. Left testicle Measurements: 3.2 x 3.2 x 5.2 cm. No mass or microlithiasis visualized. Slightly heterogeneous attenuation of the testicle is nonspecific but may represent sequela of remote infection or inflammation. Right epididymis: Incidental note is made of a 2 mm simple cyst in the right epididymis. Otherwise normal in size and appearance. Left epididymis:  Normal in size and appearance. Hydrocele:  Bilateral small hydrocele noted. Varicocele: No varicoceles. Bilateral pampiniform plexus of veins measured 2.5 mm or less in diameter, on provided images. Pulsed Doppler interrogation of both testes demonstrates normal low resistance arterial and venous waveforms bilaterally. IMPRESSION: *Bilateral small hydrocele. Otherwise essentially unremarkable exam. Electronically Signed   By: Jules Schick M.D.   On: 03/25/2023 11:26    Procedures Procedures    Medications Ordered in ED Medications - No data to display  ED Course/ Medical Decision Making/ A&P                                  32yo male with history of bipolar disorder, ADHD, who presents with concern for right testicular pain.  US shows no sign of torsion, no epididymitis, no sign of abscess,  no cellulitis, no hernia.   Labs without clinically significant anemia, electrolyte abnormalities, no leukocytosis.   UA with possible infection versus just hematuria. Will cover with keflex..  Discussed possibility of nephrolithiasis, however no flank pain at this time and feel it is reasonable to e discharged with strict return precautions and urology follow up. No flank pain or abdominal pain, no fever, no sign of sepsis.  Patient discharged in stable condition with understanding of reasons to return.         Final Clinical Impression(s) / ED Diagnoses  Final diagnoses:  Right testicular pain  Hydrocele, unspecified hydrocele type  Acute cystitis without hematuria    Rx / DC Orders ED Discharge Orders          Ordered    cephALEXin (KEFLEX) 500 MG capsule  3 times daily        03/25/23 1445              Alvira Monday, MD 03/25/23 2232

## 2023-03-27 LAB — URINE CULTURE: Culture: <10000 — AB

## 2023-03-28 LAB — GC/CHLAMYDIA PROBE AMP (~~LOC~~) NOT AT ARMC
Chlamydia: NEGATIVE
Comment: NEGATIVE
Comment: NORMAL
Neisseria Gonorrhea: NEGATIVE

## 2023-04-05 ENCOUNTER — Encounter (HOSPITAL_BASED_OUTPATIENT_CLINIC_OR_DEPARTMENT_OTHER): Payer: Self-pay

## 2023-04-05 ENCOUNTER — Emergency Department (HOSPITAL_BASED_OUTPATIENT_CLINIC_OR_DEPARTMENT_OTHER)
Admission: EM | Admit: 2023-04-05 | Discharge: 2023-04-05 | Disposition: A | Discharge status: Home / Self Care | Payer: BC Managed Care – PPO | Attending: Emergency Medicine | Admitting: Emergency Medicine

## 2023-04-05 ENCOUNTER — Emergency Department (HOSPITAL_BASED_OUTPATIENT_CLINIC_OR_DEPARTMENT_OTHER): Payer: BC Managed Care – PPO

## 2023-04-05 ENCOUNTER — Other Ambulatory Visit: Payer: Self-pay

## 2023-04-05 DIAGNOSIS — N201 Calculus of ureter: Secondary | ICD-10-CM | POA: Diagnosis not present

## 2023-04-05 DIAGNOSIS — R109 Unspecified abdominal pain: Secondary | ICD-10-CM

## 2023-04-05 HISTORY — DX: Calculus of kidney: N20.0

## 2023-04-05 LAB — BASIC METABOLIC PANEL
Anion gap: 6 (ref 5–15)
BUN: 17 mg/dL (ref 6–20)
CO2: 31 mmol/L (ref 22–32)
Calcium: 9.9 mg/dL (ref 8.9–10.3)
Chloride: 103 mmol/L (ref 98–111)
Creatinine, Ser: 1.13 mg/dL (ref 0.61–1.24)
GFR, Estimated: >60 mL/min (ref >60)
Glucose, Bld: 89 mg/dL (ref 70–99)
Potassium: 4.6 mmol/L (ref 3.5–5.1)
Sodium: 140 mmol/L (ref 135–145)

## 2023-04-05 LAB — CBC
HCT: 46.9 % (ref 39.0–52.0)
Hemoglobin: 15.8 g/dL (ref 13.0–17.0)
MCH: 28.1 pg (ref 26.0–34.0)
MCHC: 33.7 g/dL (ref 30.0–36.0)
MCV: 83.3 fL (ref 80.0–100.0)
Platelets: 299 10*3/uL (ref 150–400)
RBC: 5.63 MIL/uL (ref 4.22–5.81)
RDW: 12 % (ref 11.5–15.5)
WBC: 10.5 10*3/uL (ref 4.0–10.5)
nRBC: 0 % (ref 0.0–0.2)

## 2023-04-05 LAB — URINALYSIS, ROUTINE W REFLEX MICROSCOPIC
Bacteria, UA: NONE SEEN
Bilirubin Urine: NEGATIVE
Glucose, UA: NEGATIVE mg/dL
Ketones, ur: NEGATIVE mg/dL
Leukocytes,Ua: NEGATIVE
Nitrite: NEGATIVE
Protein, ur: NEGATIVE mg/dL
Specific Gravity, Urine: 1.014 (ref 1.005–1.030)
pH: 6 (ref 5.0–8.0)

## 2023-04-05 MED ORDER — ONDANSETRON 4 MG PO TBDP: 4.0000 mg | ORAL_TABLET | Freq: Three times a day (TID) | ORAL | 0 refills | Status: AC | PRN

## 2023-04-05 MED ORDER — TAMSULOSIN HCL 0.4 MG PO CAPS: 0.4000 mg | ORAL_CAPSULE | Freq: Every day | ORAL | 0 refills | Status: AC

## 2023-04-05 MED ORDER — KETOROLAC TROMETHAMINE 15 MG/ML IJ SOLN
15.0000 mg | Freq: Once | INTRAMUSCULAR | Status: AC
  Administered 2023-04-05: 15 mg via INTRAVENOUS
  Filled 2023-04-05: qty 1

## 2023-04-05 MED ORDER — MELOXICAM 15 MG PO TABS: 15.0000 mg | ORAL_TABLET | Freq: Every day | ORAL | Status: AC | PRN

## 2023-04-05 MED ORDER — OXYCODONE HCL 5 MG PO TABS: 2.5000 mg | ORAL_TABLET | Freq: Four times a day (QID) | ORAL | 0 refills | Status: AC | PRN

## 2023-04-05 NOTE — ED Triage Notes (Signed)
 In for further eval of right flank pain. Was seen by Red[facility] Gainer on 02/14. Completed antibiotic and still having pain. Denies N/V/D. Denies dysuria.

## 2023-04-05 NOTE — ED Notes (Addendum)
 Gust Brooms, [place] to add additional medication to pharmacy.

## 2023-04-05 NOTE — ED Provider Notes (Signed)
 Gregory [name] Provider Note   CSN: [id number] Arrival date & time: 04/05/23  4098     History  Chief Complaint  Patient presents with   Flank Pain    Gregory [name] is a 33 y.o. male.   Flank Pain   33 year old male presents emergency department with right-sided flank pain.  States that pain has been present since 03/25/2023.  Was seen in the ED at that time and had scrotal ultrasound because he had some concurrent intermittent testicular pain.  Was found with bilateral hydrocele.  States that his testicular pain has improved since his prior visit.  Was also placed on antibiotics given concern for possible urine infection by appearance of UA.  States that he took complete course of antibiotics but still had pain prompting visit to the emergency department.  States the pain is somewhat intermittent in nature.  Worsened with certain movements but also occurs at random times.  Denies radiation of pain.  Denies any fever, chills, nausea vomiting, urinary symptoms, change in bowel habits.  States he has a history of kidney stones but is unaware of whether or not this feels the same.  Past medical history significant for ADHD, bipolar disorder, kidney stone, appendicitis with appendectomy 08/27/2020,  Home Medications Prior to Admission medications   Medication Sig Start Date End Date Taking? Authorizing Provider  meloxicam (MOBIC) 15 MG tablet Take 1 tablet (15 mg total) by mouth daily as needed for pain. 04/05/23  Yes Sherian Maroon A, PA  ondansetron (ZOFRAN-ODT) 4 MG disintegrating tablet Take 1 tablet (4 mg total) by mouth every 8 (eight) hours as needed. 04/05/23  Yes Sherian Maroon A, PA  oxyCODONE (ROXICODONE) 5 MG immediate release tablet Take 0.5 tablets (2.5 mg total) by mouth every 6 (six) hours as needed for severe pain (pain score 7-10). 04/05/23  Yes Sherian Maroon A, PA      Allergies    Acetaminophen    Review of Systems    Review of Systems  Genitourinary:  Positive for flank pain.  All other systems reviewed and are negative.   Physical Exam Updated Vital Signs BP (!) 127/91   Pulse (!) 59   Temp 98.3 F (36.8 C) (Oral)   Resp 16   Ht 6' (1.829 m)   Wt 79.4 kg   SpO2 99%   BMI 23.73 kg/m  Physical Exam Vitals and nursing note reviewed.  Constitutional:      General: He is not in acute distress.    Appearance: He is well-developed.  HENT:     Head: Normocephalic and atraumatic.  Eyes:     Conjunctiva/sclera: Conjunctivae normal.  Cardiovascular:     Rate and Rhythm: Normal rate and regular rhythm.     Heart sounds: No murmur heard. Pulmonary:     Effort: Pulmonary effort is normal. No respiratory distress.     Breath sounds: Normal breath sounds. No wheezing, rhonchi or rales.  Abdominal:     Palpations: Abdomen is soft.     Tenderness: There is no abdominal tenderness. There is right CVA tenderness.  Musculoskeletal:        General: No swelling.     Cervical back: Neck supple.  Skin:    General: Skin is warm and dry.     Capillary Refill: Capillary refill takes less than 2 seconds.  Neurological:     Mental Status: He is alert.  Psychiatric:        Mood and Affect: Mood  normal.     ED Results / Procedures / Treatments   Labs (all labs ordered are listed, but only abnormal results are displayed) Labs Reviewed  URINALYSIS, ROUTINE W REFLEX MICROSCOPIC - Abnormal; Notable for the following components:      Result Value   Hgb urine dipstick TRACE (*)    All other components within normal limits  CBC  BASIC METABOLIC PANEL    EKG None  Radiology CT Renal Stone Study Result Date: 04/05/2023 CLINICAL DATA:  Right flank pain.  On antibiotics. EXAM: CT ABDOMEN AND PELVIS WITHOUT CONTRAST TECHNIQUE: Multidetector CT imaging of the abdomen and pelvis was performed following the standard protocol without IV contrast. RADIATION DOSE REDUCTION: This exam was performed according to  the departmental dose-optimization program which includes automated exposure control, adjustment of the mA and/or kV according to patient size and/or use of iterative reconstruction technique. COMPARISON:  CT 08/25/2020. FINDINGS: Lower chest: Lung bases are clear.  No pleural effusion. Hepatobiliary: No focal liver abnormality is seen. No gallstones, gallbladder wall thickening, or biliary dilatation. Pancreas: Unremarkable. No pancreatic ductal dilatation or surrounding inflammatory changes. Spleen: Spleen is enlarged with cephalocaudal length of 13.7 cm. Adrenals/Urinary Tract: Adrenal glands are preserved. No intrarenal stones identified. However there is mild to moderate right-sided collecting system dilatation diffusely down to the level of the low pelvis where there is a calcification seen on series 2, image 56 measuring 8 mm. No left ureteral stone. No left-sided collecting system dilatation. Preserved contour to the urinary bladder. Stomach/Bowel: On this non oral contrast exam the stomach is nondilated with some luminal fluid. Small bowel is nondilated. Surgical changes along the bases cecum from previous appendectomy. There is a normal course and caliber to the colon with scattered colonic stool. Vascular/Lymphatic: No significant vascular findings are present. No enlarged abdominal or pelvic lymph nodes. Reproductive: Prostate is unremarkable. Other: No free air or free fluid. Musculoskeletal: No acute or significant osseous findings. IMPRESSION: 8 mm calcification along the course of distal right ureter with proximal mild to moderate collecting system dilatation. Electronically Signed   By: Karen Kays M.D.   On: 04/05/2023 14:26    Procedures Procedures    Medications Ordered in ED Medications  ketorolac (TORADOL) 15 MG/ML injection 15 mg (15 mg Intravenous Given 04/05/23 1320)    ED Course/ Medical Decision Making/ A&P                                 Medical Decision Making Amount  and/or Complexity of Data Reviewed Labs: ordered. Radiology: ordered.  Risk Prescription drug management.   This patient presents to the ED for concern of flank pain, this involves an extensive number of treatment options, and is a complaint that carries with it a high risk of complications and morbidity.  The differential diagnosis includes not a vascular nephrolithiasis, history of sexual p.o., volvulus, appendicitis, diverticulitis, CBD pathology, cholecystitis, pancreatitis, gastritis, PUD, other   Co morbidities that complicate the patient evaluation  See HPI   Additional history obtained:  Additional history obtained from EMR External records from outside source obtained and reviewed including hospital records   Lab Tests:  I Ordered, and personally interpreted labs.  The pertinent results include: No leukocytosis.  No evidence of anemia.  Platelets within range.  No Electra abnormalities.  No renal dysfunction.  UA with trace hemoglobin but otherwise unremarkable.   Imaging Studies ordered:  I ordered imaging studies  including CT renal stone study I independently visualized and interpreted imaging which showed 8 mm stone distal ureter I agree with the radiologist interpretation   Cardiac Monitoring: / EKG:  The patient was maintained on a cardiac monitor.  I personally viewed and interpreted the cardiac monitored which showed an underlying rhythm of: Sinus rhythm   Consultations Obtained:  N/a   Problem List / ED Course / Critical interventions / Medication management  Right flank pain I ordered medication including Toradol  Reevaluation of the patient after these medicines showed that the patient improved I have reviewed the patients home medicines and have made adjustments as needed   Social Determinants of Health:  Denies tobacco, licit drug use.   Test / Admission - Considered:  Right flank pain Vitals signs within normal range and stable  throughout visit. Laboratory/imaging studies significant for: See above 33 year old male presents emergency department with complaints of right-sided flank pain ever since 2/14.  Was treated for urine infection with some improvement.  Due to continued symptoms, prompted visit again to the emergency department.  On exam, right-sided CVA tenderness.  Laboratory studies unremarkable for any acute emergent process.  CT imaging concerning for distal right 8 mm kidney stone.  Will treat patient symptoms in the outpatient setting and have him follow-up with urology.  Treatment plan discussed length with patient and he acknowledged understanding was agreeable to said plan.  Patient overall well-appearing, afebrile in no acute distress. Worrisome signs and symptoms were discussed with the patient, and the patient acknowledged understanding to return to the ED if noticed. Patient was stable upon discharge.          Final Clinical Impression(s) / ED Diagnoses Final diagnoses:  Ureterolithiasis  Right flank pain    Rx / DC Orders ED Discharge Orders     None         Peter Garter, [place] 04/05/23 1456    Margarita Grizzle, MD 04/15/23 1558

## 2023-04-05 NOTE — ED Notes (Signed)
 In to discharge paperwork, no urology referral. Excell Seltzer, [place] messaged.

## 2023-04-05 NOTE — Discharge Instructions (Signed)
 As discussed, you do have evidence of 8 mm kidney stone causing your symptoms.  Will send you home with a couple different medicines to use for pain.  Will also place you on a medicine called Flomax to help aid in passage of the stone.  Attached is number for urology to call to schedule appointment with for reevaluation.  If you develop worsening pain, fever, intractable nausea/vomiting, please return to [facility] emergency department as they have urology present there.

## 2023-05-05 ENCOUNTER — Other Ambulatory Visit: Payer: Self-pay | Admitting: Urology

## 2023-05-09 ENCOUNTER — Encounter (HOSPITAL_COMMUNITY): Payer: Self-pay | Admitting: Urology

## 2023-05-09 NOTE — Progress Notes (Signed)
 Spoke w/ via phone for pre-op interview---[facility] needs dos----KUB            COVID test -----patient states asymptomatic no test needed Arrive at -------0645 NPO after MN NO Solid Food.  Clear liquids from MN until--- Med rec completed. Pt aware to hold ASA/NSAIDs and supplements per [facility] protocol.  Medications to take morning of surgery -----oxycodone, zofran, floamax by 0400 Diabetic/Weight loss medication ----- No Alcohol or recreational drugs for 24 hours/Tobacco products for 6 hours ---- Patient instructed to bring blue lithotripsy folder, photo id and insurance card day of surgery. Patient aware to have Driver (ride ) / caregiver for 24 hours after surgery -----LeAnn [phone] Patient Special Instructions -----Stop mobic, nsaids, aspirin products Pre-Op special Instructions ----- take laxative of choice day before procedure Patient verbalized understanding of instructions that were given at this phone interview. Patient denies shortness of breath, chest pain, fever, cough at this phone interview.

## 2023-05-13 ENCOUNTER — Encounter (HOSPITAL_COMMUNITY)
Admission: RE | Disposition: A | Discharge status: Home / Self Care | Payer: Self-pay | Source: Home / Self Care | Attending: Urology

## 2023-05-13 ENCOUNTER — Encounter (HOSPITAL_COMMUNITY): Payer: Self-pay | Admitting: Urology

## 2023-05-13 ENCOUNTER — Ambulatory Visit (HOSPITAL_COMMUNITY)

## 2023-05-13 ENCOUNTER — Ambulatory Visit (HOSPITAL_COMMUNITY)
Admission: RE | Admit: 2023-05-13 | Discharge: 2023-05-13 | Disposition: A | Discharge status: Home / Self Care | Attending: Urology | Admitting: Urology

## 2023-05-13 ENCOUNTER — Other Ambulatory Visit: Payer: Self-pay

## 2023-05-13 DIAGNOSIS — N50811 Right testicular pain: Secondary | ICD-10-CM | POA: Insufficient documentation

## 2023-05-13 DIAGNOSIS — N201 Calculus of ureter: Secondary | ICD-10-CM | POA: Diagnosis present

## 2023-05-13 HISTORY — PX: EXTRACORPOREAL SHOCK WAVE LITHOTRIPSY: SHX1557

## 2023-05-13 SURGERY — LITHOTRIPSY, ESWL
Anesthesia: LOCAL | Laterality: Right

## 2023-05-13 MED ORDER — SODIUM CHLORIDE 0.9 % IV SOLN
INTRAVENOUS | Status: DC
Start: 2023-05-13 — End: 2023-05-13
  Administered 2023-05-13: 1000 mL via INTRAVENOUS

## 2023-05-13 MED ORDER — DIAZEPAM 5 MG PO TABS
10.0000 mg | ORAL_TABLET | ORAL | Status: AC
  Administered 2023-05-13: 10 mg via ORAL
  Filled 2023-05-13: qty 2

## 2023-05-13 MED ORDER — DIPHENHYDRAMINE HCL 25 MG PO CAPS
25.0000 mg | ORAL_CAPSULE | ORAL | Status: AC
  Administered 2023-05-13: 25 mg via ORAL
  Filled 2023-05-13: qty 1

## 2023-05-13 MED ORDER — CIPROFLOXACIN HCL 500 MG PO TABS
500.0000 mg | ORAL_TABLET | ORAL | Status: AC
  Administered 2023-05-13: 500 mg via ORAL
  Filled 2023-05-13: qty 1

## 2023-05-13 NOTE — Op Note (Signed)
 See [facility] OP note scanned into chart. Also because of the size, density, location and other factors that cannot be anticipated I feel this will likely be a staged procedure. This fact supersedes any indication in the scanned [place] [facility] operative note to the contrary.

## 2023-05-13 NOTE — Discharge Instructions (Addendum)
 See [facility] discharge instructions in chart.   Post Anesthesia Home Care Instructions  Activity: Get plenty of rest for the remainder of the day. A responsible individual must stay with you for 24 hours following the procedure.  For the next 24 hours, DO NOT: -Drive a car -Advertising copywriter -Drink alcoholic beverages -Take any medication unless instructed by your physician -Make any legal decisions or sign important papers.  Meals: Start with liquid foods such as gelatin or soup. Progress to regular foods as tolerated. Avoid greasy, spicy, heavy foods. If nausea and/or vomiting occur, drink only clear liquids until the nausea and/or vomiting subsides. Call your physician if vomiting continues.  Special Instructions/Symptoms: Your throat may feel dry or sore from the anesthesia or the breathing tube placed in your throat during surgery. If this causes discomfort, gargle with warm salt water. The discomfort should disappear within 24 hours.

## 2023-05-13 NOTE — H&P (Signed)
 CC/HPI: 1. scrotal pain - intermittent episode for many years, seem to be occurring more frequently. Went to the ED 2/14 complaining of worsening scrotal pain. SCRUS in ED 03/2023 unremarkable, subclinical hydroceles. Urine dip positive for blood at that time. He returned to the emergency department February 25 complaining of right flank pain. CT scan found a ureteral stone at that time.   Scrotal ultrasound overall unremarkable. There is subclinical hydroceles. Pampiniform dilation with Valsalva bilaterally.   CT scan 2/25 shows a distal right ureteral stone, 8 mm. There is associated mild hydroureteronephrosis.   On KUB today I believe I can see the stone fairly clearly. It is a little lateral. When cross-referencing with the CT scan, there were no phleboliths in this area however.   No acute complaints today. He has been effectively symptom-free from his stone since going to the emergency department. He does have a history of stones; has passed several before.     ALLERGIES: Tylenol Z- Pack    MEDICATIONS: Tamsulosin HCl 0.4 MG Capsule  Meloxicam 15 MG Tablet  Ondansetron 4 MG Tablet Disintegrating     GU PSH: None   NON-GU PSH: Appendectomy - 2022 Brain Surgery (Unspecified) Nose Surgery (Unspecified) - 2011     GU PMH: None     PMH Notes: History of kidney stones  Chiari malformation    NON-GU PMH: None   FAMILY HISTORY: 2 sons - Runs in Family Chiari Malformation - Mother Crohn's Disease - Sister Kidney Cancer - Mother Kidney Stones - Father, Mother scoliosis - Sister   SOCIAL HISTORY: Marital Status: Married Preferred Language: English; Ethnicity: Not Hispanic Or Latino; Race: White Current Smoking Status: Patient does not smoke anymore. Has not smoked since 04/08/2016. Smoked for 10 years. Smoked 1/2 pack per day.   Tobacco Use Assessment Completed: Used Tobacco in last 30 days? Does not use smokeless tobacco. Has never drank.  Does not use drugs. Drinks 3  caffeinated drinks per day. Has not had a blood transfusion.    REVIEW OF SYSTEMS:    GU Review Male:   Patient reports get up at night to urinate. Patient denies frequent urination, hard to postpone urination, burning/ pain with urination, leakage of urine, stream starts and stops, trouble starting your stream, have to strain to urinate , erection problems, and penile pain.  Gastrointestinal (Upper):   Patient reports nausea and vomiting. Patient denies indigestion/ heartburn.  Gastrointestinal (Lower):   Patient denies diarrhea and constipation.  Constitutional:   Patient denies fever, night sweats, weight loss, and fatigue.  Skin:   Patient denies skin rash/ lesion and itching.  Eyes:   Patient denies blurred vision and double vision.  Ears/ Nose/ Throat:   Patient denies sore throat and sinus problems.  Hematologic/Lymphatic:   Patient denies easy bruising and swollen glands.  Cardiovascular:   Patient denies leg swelling and chest pains.  Respiratory:   Patient denies cough and shortness of breath.  Endocrine:   Patient denies excessive thirst.  Musculoskeletal:   Patient reports back pain. Patient denies joint pain.  Neurological:   Patient denies headaches and dizziness.  Psychologic:   Patient denies depression and anxiety.   VITAL SIGNS:      05/02/2023 09:43 AM  Weight 170 lb / 77.11 kg  Height 72 in / 182.88 cm  BP 136/91 mmHg  Pulse 81 /min  BMI 23.1 kg/m   GU PHYSICAL EXAMINATION:    Scrotum: No lesions. No edema. No cysts. No warts.  Epididymides: Right:  no spermatocele, no masses, no cysts, no tenderness, no induration, no enlargement. Left: no spermatocele, no masses, no cysts, no tenderness, no induration, no enlargement.  Testes: No tenderness, no swelling, no enlargement left testes. No tenderness, no swelling, no enlargement right testes. Normal location left testes. Normal location right testes. No mass, no cyst, no varicocele, no hydrocele left testes. No mass,  no cyst, no varicocele, no hydrocele right testes.  Urethral Meatus: Normal size. No lesion, no wart, no discharge, no polyp. Normal location.  Penis: Circumcised, no warts, no cracks. No dorsal Peyronie's plaques, no left corporal Peyronie's plaques, no right corporal Peyronie's plaques, no scarring, no warts. No balanitis, no meatal stenosis.   MULTI-SYSTEM PHYSICAL EXAMINATION:    Constitutional: Well-nourished. No physical deformities. Normally developed. Good grooming.  Neck: Neck symmetrical, not swollen. Normal tracheal position.  Respiratory: No labored breathing, no use of accessory muscles.   Cardiovascular: Normal temperature, normal extremity pulses, no swelling, no varicosities.  Lymphatic: No enlargement of neck, axillae, groin.  Skin: No paleness, no jaundice, no cyanosis. No lesion, no ulcer, no rash.  Neurologic / Psychiatric: Oriented to time, oriented to place, oriented to person. No depression, no anxiety, no agitation.  Gastrointestinal: No mass, no tenderness, no rigidity, non obese abdomen.  Eyes: Normal conjunctivae. Normal eyelids.  Ears, Nose, Mouth, and Throat: Left ear no scars, no lesions, no masses. Right ear no scars, no lesions, no masses. Nose no scars, no lesions, no masses. Normal hearing. Normal lips.  Musculoskeletal: Normal gait and station of head and neck.     Complexity of Data:  Source Of History:  Patient, Medical Record Summary  Records Review:   Previous Doctor Records, Previous Patient Records  Urine Test Review:   Urinalysis  X-Ray Review: Outside CT: Reviewed Films. Reviewed Report. Discussed With Patient.  Outside Ultrasound: Reviewed Films. Reviewed Report. Discussed With Patient.  KUB: Reviewed Films. Discussed With Patient.     PROCEDURES:         KUB - [id number]  A single view of the abdomen is obtained. Calcification seen in the pelvis on the right that is consistent with the patient's stone. No other calcifications within either renal  shadow or along the expected course of the ureter.      Patient confirmed No Neulasta OnPro Device.           Urinalysis Dipstick Dipstick Cont'd  Color: Yellow Bilirubin: Neg mg/dL  Appearance: Clear Ketones: Neg mg/dL  Specific Gravity: 4.098 Blood: Neg ery/uL  pH: 6.0 Protein: Neg mg/dL  Glucose: Neg mg/dL Urobilinogen: 0.2 mg/dL    Nitrites: Neg    Leukocyte Esterase: Neg leu/uL    ASSESSMENT:      ICD-10 Details  1 GU:   Ureteral obstruction secondary to calculous - N13.2 Undiagnosed New Problem  2   Ureteral calculus - N20.1 Undiagnosed New Problem  3   Right testicular pain - N50.811 Undiagnosed New Problem   PLAN:           Orders X-Rays: KUB          Schedule         Document Letter(s):  Created for Patient: Clinical Summary         Notes:   I discussed the imaging findings with the patient. For now recommend focusing on treating the stone. We then could revisit his right scrotal content pain once he has recovered. Options were discussed extensively including ESWL and ureteroscopy with laser litho-. Ultimately he was interested  in moving forward with ESWL but will need to check his work schedule. We discussed that risk include hematuria, infection, sepsis, residual stone fragments, need for future or additional procedures, hematoma.

## 2023-05-16 ENCOUNTER — Encounter (HOSPITAL_COMMUNITY): Payer: Self-pay | Admitting: Urology

## 2023-05-30 ENCOUNTER — Other Ambulatory Visit: Payer: Self-pay | Admitting: Urology

## 2023-06-03 NOTE — Progress Notes (Signed)
 Left voicemail for patient to stop Mobic  and all NSAIDS on Tuesday night, arrive at [facility] main entrance at 5409, bring license and insurance card, nothing to eat or drink after MN on Thursday, take laxative of choice on Thursday, eat light meal that evening and drink lots of water to hydrate well. Informed that will call back on Monday.

## 2023-06-06 NOTE — Progress Notes (Signed)
 Left voicemail instructing patient to bring blue folder from [facility] and do not put any dates, times, or witness signatures on forms, to wear substantial shoes ( no flip flops, sandals, or crocs), wear comfortable clothing with no metal from waist down, call [phone] on the day of the procedure if having any issues or [facility] [phone] between now and Friday if having any issues or symptoms of illness, do not have any alcohol  or use tobacco, if applicable, for 24 hrs prior to the procedure. Informed patient that we will try to call again.

## 2023-06-08 ENCOUNTER — Encounter (HOSPITAL_COMMUNITY): Payer: Self-pay | Admitting: Urology

## 2023-06-08 NOTE — Progress Notes (Signed)
 Left voicemail stating that I will call back later

## 2023-06-08 NOTE — Progress Notes (Signed)
 Spoke w/ patient via phone for pre-op interview--- Lab needs dos----KUB         Lab results------ COVID test --not indicated---patient states asymptomatic no test needed Arrive at ------0645 NPO after MN NO  Pre-Surgery Ensure or G2:  Med rec completed Medications to take morning of surgery --none diabetic medication ----none-  GLP1 agonist last dose: GLP1 instructions:  Patient instructed no nail polish to be worn day of surgery Patient instructed to bring blue folder, photo id and insurance card day of surgery Patient aware to have Driver (ride ) / caregiver    for 24 hours after surgery - spouse- Autumn Patient Special Instructions ----- Pre-Op special Instructions ----per [facility]-  Patient verbalized understanding of instructions that were given at this phone interview. yes Patient denies chest pain, sob, fever, cough at the interview.

## 2023-06-09 NOTE — H&P (Signed)
 05/26/2023: Follow-up today after undergoing shockwave lithotripsy on 4/4 to treat a previously identified distal right ureteral calculus. He tolerated the procedure well, no interval recurrence of unilateral pain/discomfort suggestive of obstructive uropathy. Denies interval new or worsening LUTS including absence of any dysuria or gross hematuria. He has not seen any interval stone material passage.     ALLERGIES: Tylenol  Z- Pack    MEDICATIONS: Tamsulosin  HCl 0.4 MG Capsule  Meloxicam  15 MG Tablet  Ondansetron  4 MG Tablet Disintegrating     GU PSH: No GU PSH    NON-GU PSH: Appendectomy - 2022 Brain Surgery (Unspecified) Nose Surgery (Unspecified) - 2011     GU PMH: Right testicular pain - 05/02/2023 Ureteral calculus - 05/02/2023 Ureteral obstruction secondary to calculous - 05/02/2023      PMH Notes: History of kidney stones  Chiari malformation    NON-GU PMH: No Non-GU PMH    FAMILY HISTORY: 2 sons - Runs in Family Chiari Malformation - Mother Crohn's Disease - Sister Kidney Cancer - Mother Kidney Stones - Father, Mother scoliosis - Sister   SOCIAL HISTORY: Marital Status: Married Preferred Language: English; Ethnicity: Not Hispanic Or Latino; Race: White Current Smoking Status: Patient does not smoke anymore. Has not smoked since 04/08/2016. Smoked for 10 years. Smoked 1/2 pack per day.   Tobacco Use Assessment Completed: Used Tobacco in last 30 days? Does not use smokeless tobacco. Has never drank.  Does not use drugs. Drinks 3 caffeinated drinks per day. Has not had a blood transfusion.    REVIEW OF SYSTEMS:    GU Review Male:   Patient denies frequent urination, hard to postpone urination, burning/ pain with urination, get up at night to urinate, leakage of urine, stream starts and stops, trouble starting your stream, have to strain to urinate , erection problems, and penile pain.  Gastrointestinal (Upper):   Patient denies vomiting, nausea, and indigestion/  heartburn.  Gastrointestinal (Lower):   Patient denies diarrhea and constipation.  Constitutional:   Patient denies fever, night sweats, weight loss, and fatigue.  Skin:   Patient denies skin rash/ lesion and itching.  Eyes:   Patient denies blurred vision and double vision.  Ears/ Nose/ Throat:   Patient denies sore throat and sinus problems.  Hematologic/Lymphatic:   Patient denies swollen glands and easy bruising.  Cardiovascular:   Patient denies leg swelling and chest pains.  Respiratory:   Patient denies cough and shortness of breath.  Endocrine:   Patient denies excessive thirst.  Musculoskeletal:   Patient denies back pain and joint pain.  Neurological:   Patient denies headaches and dizziness.  Psychologic:   Patient denies depression and anxiety.   VITAL SIGNS:      05/26/2023 01:25 PM  Weight 170 lb / 77.11 kg  Height 72 in / 182.88 cm  BP 118/69 mmHg  Pulse 73 /min  BMI 23.1 kg/m   MULTI-SYSTEM PHYSICAL EXAMINATION:    Constitutional: Well-nourished. No physical deformities. Normally developed. Good grooming.  Neck: Neck symmetrical, not swollen. Normal tracheal position.  Respiratory: No labored breathing, no use of accessory muscles.   Cardiovascular: Normal temperature, normal extremity pulses, no swelling, no varicosities.  Skin: No paleness, no jaundice, no cyanosis. No lesion, no ulcer, no rash.  Neurologic / Psychiatric: Oriented to time, oriented to place, oriented to person. No depression, no anxiety, no agitation.  Gastrointestinal: No mass, no tenderness, no rigidity, non obese abdomen.  Musculoskeletal: Normal gait and station of head and neck.     Complexity  of Data:  Source Of History:  Patient, Medical Record Summary  Records Review:   Previous Doctor Records, Previous Hospital Records, Previous Patient Records  Urine Test Review:   Urinalysis  X-Ray Review: KUB: Reviewed Films. Discussed With Patient.  C.T. Abdomen/Pelvis: Reviewed Films. Reviewed  Report.     05/26/23  Urinalysis  Urine Appearance Clear   Urine Color Yellow   Urine Glucose Neg mg/dL  Urine Bilirubin Neg mg/dL  Urine Ketones Neg mg/dL  Urine Specific Gravity 1.025   Urine Blood Neg ery/uL  Urine pH 5.5   Urine Protein Neg mg/dL  Urine Urobilinogen 0.2 mg/dL  Urine Nitrites Neg   Urine Leukocyte Esterase Neg leu/uL   PROCEDURES:         KUB - 16109  A single view of the abdomen is obtained. Previously identified distal right ureteral calculi remains grossly unchanged in size/shape/complexity compared to prior study.      Patient confirmed No Neulasta OnPro Device.           Urinalysis Dipstick Dipstick Cont'd  Color: Yellow Bilirubin: Neg mg/dL  Appearance: Clear Ketones: Neg mg/dL  Specific Gravity: 6.045 Blood: Neg ery/uL  pH: 5.5 Protein: Neg mg/dL  Glucose: Neg mg/dL Urobilinogen: 0.2 mg/dL    Nitrites: Neg    Leukocyte Esterase: Neg leu/uL    ASSESSMENT:      ICD-10 Details  1 GU:   Ureteral calculus - N20.1    PLAN:           Orders Labs Urine Culture          Document Letter(s):  Created for Patient: Clinical Summary         Notes:   Right ureteral stone remains unchanged on KUB following ESWL on the 4th of this month. We discussed treatment strategies moving forward. He would prefer ESWL. Understands if second attempt is unsuccessful we would recommend ureteroscopy for more definitive management.   For shockwave lithotripsy I described the risks which include arrhythmia, kidney contusion, kidney hemorrhage, need for transfusion, long-term risk of diabetes or hypertension, back discomfort, flank ecchymosis, flank abrasion, inability to break up stone, inability to pass stone fragments, Steinstrasse, infection associated with obstructing stones, need for different surgical procedure and possible need for repeat shockwave lithotripsy.   In the interval while being scheduled, should he develop recurrent pain/discomfort suggestive of  worsening obstructive uropathy with or without new or worsening LUTS he will contact the office. If he starts passing stone material in the interval as well he will let us  know.   Scheduling sheet submitted today, he will be set up for repeat shockwave lithotripsy in the near future.

## 2023-06-10 ENCOUNTER — Ambulatory Visit (HOSPITAL_COMMUNITY)
Admission: RE | Admit: 2023-06-10 | Discharge: 2023-06-10 | Disposition: A | Discharge status: Home / Self Care | Attending: Urology | Admitting: Urology

## 2023-06-10 ENCOUNTER — Encounter (HOSPITAL_COMMUNITY)
Admission: RE | Disposition: A | Discharge status: Home / Self Care | Payer: Self-pay | Source: Home / Self Care | Attending: Urology

## 2023-06-10 ENCOUNTER — Ambulatory Visit (HOSPITAL_COMMUNITY)

## 2023-06-10 ENCOUNTER — Encounter (HOSPITAL_COMMUNITY): Payer: Self-pay | Admitting: Urology

## 2023-06-10 ENCOUNTER — Other Ambulatory Visit: Payer: Self-pay

## 2023-06-10 DIAGNOSIS — Z87442 Personal history of urinary calculi: Secondary | ICD-10-CM | POA: Diagnosis not present

## 2023-06-10 DIAGNOSIS — Z87891 Personal history of nicotine dependence: Secondary | ICD-10-CM | POA: Insufficient documentation

## 2023-06-10 DIAGNOSIS — N201 Calculus of ureter: Secondary | ICD-10-CM | POA: Diagnosis present

## 2023-06-10 HISTORY — DX: Personal history of urinary calculi: Z87.442

## 2023-06-10 HISTORY — PX: EXTRACORPOREAL SHOCK WAVE LITHOTRIPSY: SHX1557

## 2023-06-10 SURGERY — LITHOTRIPSY, ESWL
Anesthesia: LOCAL | Laterality: Right

## 2023-06-10 MED ORDER — SODIUM CHLORIDE 0.9 % IV SOLN: INTRAVENOUS | Status: DC

## 2023-06-10 MED ORDER — DIPHENHYDRAMINE HCL 25 MG PO CAPS
25.0000 mg | ORAL_CAPSULE | ORAL | Status: AC
  Administered 2023-06-10: 25 mg via ORAL
  Filled 2023-06-10: qty 1

## 2023-06-10 MED ORDER — DIAZEPAM 5 MG PO TABS
10.0000 mg | ORAL_TABLET | ORAL | Status: AC
  Administered 2023-06-10: 10 mg via ORAL
  Filled 2023-06-10: qty 2

## 2023-06-10 MED ORDER — BISACODYL 5 MG PO TBEC
5.0000 mg | DELAYED_RELEASE_TABLET | Freq: Every day | ORAL | Status: DC | PRN
  Administered 2023-06-10: 5 mg via ORAL

## 2023-06-10 MED ORDER — TAMSULOSIN HCL 0.4 MG PO CAPS: 0.4000 mg | ORAL_CAPSULE | Freq: Every day | ORAL | 1 refills | Status: AC

## 2023-06-10 MED ORDER — CIPROFLOXACIN HCL 500 MG PO TABS
500.0000 mg | ORAL_TABLET | ORAL | Status: AC
  Administered 2023-06-10: 500 mg via ORAL
  Filled 2023-06-10: qty 1

## 2023-06-10 NOTE — Interval H&P Note (Signed)
 History and Physical Interval Note:  06/10/2023 8:36 AM  Webb T Gohman  has presented today for surgery, with the diagnosis of RIGHT URETERAL CALCULI.  The various methods of treatment have been discussed with the patient and family. After consideration of risks, benefits and other options for treatment, the patient has consented to  Procedure(s) with comments: LITHOTRIPSY, ESWL (Right) - RIGHT EXTRACORPORAL SHOCKWAVE LITHOTRIPSY as a surgical intervention.  The patient's history has been reviewed, patient examined, no change in status, stable for surgery.  I have reviewed the patient's chart and labs.  Questions were answered to the patient's satisfaction.     Meliya Mcconahy A Kalea Perine

## 2023-06-13 ENCOUNTER — Encounter (HOSPITAL_COMMUNITY): Payer: Self-pay | Admitting: Urology
